# Patient Record
Sex: Female | Born: 1995 | Race: Black or African American | Hispanic: No | Marital: Single | Smoking: Former smoker
Health system: Southern US, Community
[De-identification: ages and names within clinical notes are randomized; demographics above are authoritative.]

## PROBLEM LIST (undated history)

## (undated) HISTORY — PX: OTHER SURGICAL HISTORY: SHX169

---
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
## 2017-05-15 ENCOUNTER — Encounter (HOSPITAL_COMMUNITY): Payer: Self-pay

## 2017-05-15 ENCOUNTER — Other Ambulatory Visit: Payer: Self-pay

## 2017-05-15 ENCOUNTER — Emergency Department (HOSPITAL_COMMUNITY)
Admission: EM | Admit: 2017-05-15 | Discharge: 2017-05-15 | Disposition: A | Discharge status: Home / Self Care | Payer: Self-pay | Attending: Emergency Medicine | Admitting: Emergency Medicine

## 2017-05-15 DIAGNOSIS — Y999 Unspecified external cause status: Secondary | ICD-10-CM | POA: Insufficient documentation

## 2017-05-15 DIAGNOSIS — S46202A Unspecified injury of muscle, fascia and tendon of other parts of biceps, left arm, initial encounter: Secondary | ICD-10-CM | POA: Insufficient documentation

## 2017-05-15 DIAGNOSIS — T1490XA Injury, unspecified, initial encounter: Secondary | ICD-10-CM

## 2017-05-15 DIAGNOSIS — M791 Myalgia, unspecified site: Secondary | ICD-10-CM

## 2017-05-15 DIAGNOSIS — X509XXA Other and unspecified overexertion or strenuous movements or postures, initial encounter: Secondary | ICD-10-CM | POA: Insufficient documentation

## 2017-05-15 DIAGNOSIS — M60822 Other myositis, left upper arm: Secondary | ICD-10-CM | POA: Insufficient documentation

## 2017-05-15 DIAGNOSIS — Y939 Activity, unspecified: Secondary | ICD-10-CM | POA: Insufficient documentation

## 2017-05-15 DIAGNOSIS — S46201A Unspecified injury of muscle, fascia and tendon of other parts of biceps, right arm, initial encounter: Secondary | ICD-10-CM | POA: Insufficient documentation

## 2017-05-15 DIAGNOSIS — Y929 Unspecified place or not applicable: Secondary | ICD-10-CM | POA: Insufficient documentation

## 2017-05-15 DIAGNOSIS — M609 Myositis, unspecified: Secondary | ICD-10-CM

## 2017-05-15 MED ORDER — ACETAMINOPHEN 325 MG PO TABS
650.0000 mg | ORAL_TABLET | Freq: Once | ORAL | Status: AC
Start: 2017-05-15 — End: 2017-05-15
  Administered 2017-05-15: 650 mg via ORAL
  Filled 2017-05-15: qty 2

## 2017-05-15 MED ORDER — IBUPROFEN 600 MG PO TABS: ORAL_TABLET | ORAL | 0 refills | Status: DC

## 2017-05-15 MED ORDER — ACETAMINOPHEN ER 650 MG PO TBCR: 650.0000 mg | EXTENDED_RELEASE_TABLET | Freq: Three times a day (TID) | ORAL | 0 refills | Status: DC | PRN

## 2017-05-15 NOTE — ED Provider Notes (Signed)
22 year old female comes in a chief complaint of bilateral arm pain, left worse than right.  2 days ago patient went into her first training session [facility] Provider Note   CSN: [id number] Arrival date & time: 05/15/17  4098     History   Chief Complaint Chief Complaint  Patient presents with  . Arm Pain    HPI Melinda Garrett is a 22 y.o. female.  HPI  22 year old female comes in with chief complaint of arm pain.  Patient has no significant medical history.  She states that 2 days ago she had her first personal training session.  Pt had maxed out on upper body workout.  Patient was sore yesterday, but was able to function.  Today her symptoms are severe and she is having difficulty moving her extremity without severe pain. No associated nausea, fevers.  History reviewed. No pertinent past medical history.  Patient Active Problem List   Diagnosis Date Noted  . Vertigo 10/23/2012    Past Surgical History:  Procedure Laterality Date  . umbilical surgery      OB History    No data available       Home Medications    Prior to Admission medications   Medication Sig Start Date End Date Taking? Authorizing Provider  acetaminophen (TYLENOL 8 HOUR) 650 MG CR tablet Take 1 tablet (650 mg total) by mouth every 8 (eight) hours as needed for pain. 05/15/17   Derwood Kaplan, MD  ibuprofen (ADVIL,MOTRIN) 600 MG tablet Take 1 tab PO Q6h x 2 days then Q6h prn 05/15/17   Derwood Kaplan, MD    Family History Family History  Problem Relation Age of Onset  . Seizures Sister        1 sister has sz, CP, Dev Delays  . Cerebral palsy Sister   . Other Sister   . Autism Cousin        Maternal second cousin has Autism    Social History Social History   Tobacco Use  . Smoking status: Never Smoker  . Smokeless tobacco: Never Used  Substance Use Topics  . Alcohol use: No  . Drug use: No     Allergies   Patient has no known  allergies.   Review of Systems Review of Systems  Constitutional: Positive for activity change.  Cardiovascular: Negative for chest pain.  Genitourinary: Negative for decreased urine volume and difficulty urinating.  Musculoskeletal: Positive for myalgias.  Skin: Negative for rash.     Physical Exam Updated Vital Signs BP 138/89 (BP Location: Left Arm)   Pulse 65   Temp 97.9 F (36.6 C) (Oral)   Resp 15   Ht 5\' 6"  (1.676 m)   Wt 94.3 kg (207 lb 14.4 oz)   LMP 05/13/2017 (Exact Date)   SpO2 98%   BMI 33.56 kg/m   Physical Exam  Constitutional: She is oriented to person, place, and time. She appears well-developed.  HENT:  Head: Normocephalic and atraumatic.  Eyes: EOM are normal.  Neck: Normal range of motion. Neck supple.  Cardiovascular: Normal rate.  Pulmonary/Chest: Effort normal.  Abdominal: Bowel sounds are normal.  Musculoskeletal:  Tenderness to palpation over the bicep and tricep of the left upper extremity. Neurovascularly intact upper extremities  Neurological: She is alert and oriented to person, place, and time.  Skin: Skin is warm and dry.  Nursing note and vitals reviewed.    ED Treatments / Results  Labs (all labs ordered are listed, but only  abnormal results are displayed) Labs Reviewed - No data to display  EKG  EKG Interpretation None       Radiology No results found.  Procedures Procedures (including critical care time)  Medications Ordered in ED Medications  acetaminophen (TYLENOL) tablet 650 mg (650 mg Oral Given 05/15/17 0929)     Initial Impression / Assessment and Plan / ED Course  I have reviewed the triage vital signs and the nursing notes.  Pertinent labs & imaging results that were available during my care of the patient were reviewed by me and considered in my medical decision making (see chart for details).    22 year old female comes in with chief complaint of bilateral arm pain, left worse than right. Patient  has reproducible tenderness to palpation.  There is no rash, patient is neurovascularly intact.  I suspect the patient's myalgias are due to significant muscle breakdown from her workout.  Plan is to start with warm compresses, increase fluid intake and as needed pain medication. Also advised her to increase gentle stretching exercises.   Final Clinical Impressions(s) / ED Diagnoses   Final diagnoses:  Myositis of left upper arm, unspecified myositis type  Myalgia  Muscle injury    ED Discharge Orders        Ordered    ibuprofen (ADVIL,MOTRIN) 600 MG tablet     05/15/17 0931    acetaminophen (TYLENOL 8 HOUR) 650 MG CR tablet  Every 8 hours PRN     05/15/17 0931       Derwood KaplanNanavati, Clifton Kovacic, MD 05/15/17 [phone]

## 2017-05-15 NOTE — ED Triage Notes (Signed)
She states she recently "worked out a lot". She c/o persistent left arm muscle aches, especially of bicp/triceps areas. She is in no distress. She denies injury.

## 2017-05-15 NOTE — Discharge Instructions (Signed)
Your pain is likely due to significant muscle breakdown and lactic acid build up. Hydrate well. Warm soaks and massage will help. Stretching exercises recommended.

## 2018-05-26 ENCOUNTER — Other Ambulatory Visit: Payer: Self-pay

## 2018-05-26 ENCOUNTER — Encounter (HOSPITAL_COMMUNITY): Payer: Self-pay

## 2018-05-26 ENCOUNTER — Emergency Department (HOSPITAL_COMMUNITY)
Admission: EM | Admit: 2018-05-26 | Discharge: 2018-05-26 | Disposition: A | Discharge status: Home / Self Care | Payer: Medicaid Other | Attending: Emergency Medicine | Admitting: Emergency Medicine

## 2018-05-26 DIAGNOSIS — J029 Acute pharyngitis, unspecified: Secondary | ICD-10-CM

## 2018-05-26 LAB — GROUP A STREP BY PCR: Group A Strep by PCR: NOT DETECTED

## 2018-05-26 NOTE — ED Provider Notes (Signed)
[facility]   CSN: [id number] Arrival date & time: 05/26/18  1409    History   Chief Complaint Chief Complaint  Patient presents with  . Sore Throat    HPI Melinda Garrett is a 23 y.o. female.     HPI Patient presents with sore throat.  Has had for the last several days.  Had been in [place].  1 of the people that she was there with was diagnosed with COVID-19.  Patient states she was with her but did not stay at the same motel or with was not in the same car on the way down.  Patient came back on the 14th with today being the 27th.  States she had some fevers for a couple days after getting back but those have resolved.  Occasional cough but has had a sore throat more recently.  Dull.  Worse with swallowing.  No difficulty swallowing.  Denies possibility of pregnancy.  No chest pain.  No abdominal pain. History reviewed. No pertinent past medical history.  Patient Active Problem List   Diagnosis Date Noted  . Vertigo 10/23/2012    Past Surgical History:  Procedure Laterality Date  . umbilical surgery       OB History   No obstetric history on file.      Home Medications    Prior to Admission medications   Medication Sig Start Date End Date Taking? Authorizing Provider  acetaminophen (TYLENOL 8 HOUR) 650 MG CR tablet Take 1 tablet (650 mg total) by mouth every 8 (eight) hours as needed for pain. 05/15/17   Derwood Kaplan, MD  ibuprofen (ADVIL,MOTRIN) 600 MG tablet Take 1 tab PO Q6h x 2 days then Q6h prn 05/15/17   Derwood Kaplan, MD    Family History Family History  Problem Relation Age of Onset  . Seizures Sister        1 sister has sz, CP, Dev Delays  . Cerebral palsy Sister   . Other Sister   . Autism Cousin        Maternal second cousin has Autism    Social History Social History   Tobacco Use  . Smoking status: Never Smoker  . Smokeless tobacco: Never Used  Substance Use Topics  .  Alcohol use: No  . Drug use: No     Allergies   Patient has no known allergies.   Review of Systems Review of Systems  Constitutional: Positive for fever. Negative for appetite change.       No current fever   HENT: Positive for sore throat. Negative for trouble swallowing.   Respiratory: Positive for cough. Negative for shortness of breath.   Cardiovascular: Negative for chest pain.  Gastrointestinal: Negative for abdominal pain.  Genitourinary: Negative for flank pain.  Musculoskeletal: Negative for back pain.  Skin: Negative for rash.  Neurological: Negative for weakness.  Hematological: Negative for adenopathy.  Psychiatric/Behavioral: Negative for confusion.     Physical Exam Updated Vital Signs BP 123/79   Pulse 80   Temp 99 F (37.2 C) (Oral)   Resp 16   Ht 5\' 6"  (1.676 m)   Wt 97.5 kg   LMP 05/10/2018   SpO2 96%   BMI 34.70 kg/m   Physical Exam Vitals signs and nursing note reviewed.  HENT:     Head: Normocephalic.     Mouth/Throat:     Pharynx: Posterior oropharyngeal erythema present. No oropharyngeal exudate.  Cardiovascular:  Rate and Rhythm: Normal rate and regular rhythm.  Pulmonary:     Effort: Pulmonary effort is normal.  Chest:     Chest wall: No tenderness.  Abdominal:     Palpations: Abdomen is soft.     Tenderness: There is no abdominal tenderness.  Lymphadenopathy:     Cervical: Cervical adenopathy present.  Skin:    General: Skin is warm.     Capillary Refill: Capillary refill takes less than 2 seconds.  Neurological:     Mental Status: She is alert.      ED Treatments / Results  Labs (all labs ordered are listed, but only abnormal results are displayed) Labs Reviewed  GROUP A STREP BY PCR    EKG None  Radiology No results found.  Procedures Procedures (including critical care time)  Medications Ordered in ED Medications - No data to display   Initial Impression / Assessment and Plan / ED Course  I have  reviewed the triage vital signs and the nursing notes.  Pertinent labs & imaging results that were available during my care of the patient were reviewed by me and considered in my medical decision making (see chart for details).       Patient with sore throat.  Had had a potential covert exposure, however at this point I think she is cleared from possible infection.  She is been more than 72 hours without fever and has been more than 7 days since start of fever.  Strep test pending.  Care turned over to Dr. Jacqulyn Bath.  Final Clinical Impressions(s) / ED Diagnoses   Final diagnoses:  Pharyngitis, unspecified etiology    ED Discharge Orders    None       Benjiman Core, MD 05/26/18 [phone]

## 2018-05-26 NOTE — Discharge Instructions (Addendum)
You have been seen in the Emergency Department (ED) today for a likely viral illness.  Please drink plenty of clear fluids (water, Gatorade, chicken broth, etc).  You may use Tylenol and/or Motrin according to label instructions.  You can alternate between the two without any side effects.   Please follow up with your doctor as listed above.  Please stay home for the next 2 weeks and avoid contact with others.   Call your doctor or return to the Emergency Department (ED) if you are unable to tolerate fluids due to vomiting, have worsening trouble breathing, become extremely tired or difficult to awaken, or if you develop any other symptoms that concern you.

## 2018-05-26 NOTE — ED Triage Notes (Addendum)
Patient c/o sore throat and was at [place], [place] from 3/9-3/13/20. Patient states one of the people she was around at [employer] did test positive for Covid-19

## 2018-05-26 NOTE — ED Provider Notes (Addendum)
Blood pressure 123/79, pulse 80, temperature 99 F (37.2 C), temperature source Oral, resp. rate 16, height 5\' 6"  (1.676 m), weight 97.5 kg, last menstrual period 05/10/2018, SpO2 96 %.  Assuming care from Dr. Rubin Payor.  In short, Melinda Garrett is a 23 y.o. female with a chief complaint of Sore Throat .  Refer to the original H&P for additional details.  The current plan of care is to f/u on strep swab. No indication for outpatient COVID testing. COVID contact was greater than 2 weeks prior so no indication for additional quarantine agreement.   04:00 PM  Strep negative.  I did advise that the patient remain in quarantine for 2 weeks.  She is having sore throat and has lost smell/taste. Patient understands recommendations to stay home. Discussed ED return precautions.   Melinda Garrett was evaluated in Emergency Department on 05/26/2018 for the symptoms described in the history of present illness. She was evaluated in the context of the global COVID-19 pandemic, which necessitated consideration that the patient might be at risk for infection with the SARS-CoV-2 virus that causes COVID-19. Institutional protocols and algorithms that pertain to the evaluation of patients at risk for COVID-19 are in a state of rapid change based on information released by regulatory bodies including the CDC and federal and state organizations. These policies and algorithms were followed during the patient's care in the ED.    Maia Plan, MD 05/26/18 1608    Maia Plan, MD 05/26/18 [phone]

## 2018-05-26 NOTE — ED Notes (Signed)
MD at bedside. 

## 2019-07-27 ENCOUNTER — Ambulatory Visit: Payer: Medicaid Other | Attending: Nurse Practitioner | Admitting: Nurse Practitioner

## 2019-07-27 ENCOUNTER — Other Ambulatory Visit: Payer: Self-pay

## 2019-09-26 ENCOUNTER — Telehealth: Payer: Self-pay | Admitting: Nurse Practitioner

## 2020-04-02 ENCOUNTER — Ambulatory Visit: Payer: Medicaid Other | Attending: Nurse Practitioner | Admitting: Nurse Practitioner

## 2020-04-02 ENCOUNTER — Other Ambulatory Visit: Payer: Self-pay

## 2020-04-04 ENCOUNTER — Telehealth: Payer: Self-pay

## 2020-04-04 NOTE — Telephone Encounter (Signed)
Called patient and LVM advising her to call [phone] in order to reschedule her missed appointment on 04/02/20.

## 2020-07-04 ENCOUNTER — Ambulatory Visit: Payer: Medicaid Other | Admitting: Nurse Practitioner

## 2020-12-22 ENCOUNTER — Ambulatory Visit (INDEPENDENT_AMBULATORY_CARE_PROVIDER_SITE_OTHER): Payer: 59 | Admitting: Family Medicine

## 2020-12-22 ENCOUNTER — Other Ambulatory Visit: Payer: Self-pay

## 2020-12-22 ENCOUNTER — Encounter: Payer: Self-pay | Admitting: Family Medicine

## 2020-12-22 VITALS — BP 126/82 | HR 74 | Temp 98.1°F | Resp 16 | Ht 66.0 in | Wt 240.8 lb

## 2020-12-22 DIAGNOSIS — E6609 Other obesity due to excess calories: Secondary | ICD-10-CM | POA: Diagnosis not present

## 2020-12-22 DIAGNOSIS — Z6838 Body mass index (BMI) 38.0-38.9, adult: Secondary | ICD-10-CM

## 2020-12-22 DIAGNOSIS — Z0000 Encounter for general adult medical examination without abnormal findings: Secondary | ICD-10-CM

## 2020-12-22 DIAGNOSIS — Z7689 Persons encountering health services in other specified circumstances: Secondary | ICD-10-CM

## 2020-12-23 ENCOUNTER — Encounter: Payer: Self-pay | Admitting: Family Medicine

## 2020-12-23 LAB — CMP14+EGFR
ALT: 13 IU/L (ref 0–32)
AST: 15 IU/L (ref 0–40)
Albumin/Globulin Ratio: 1.8 (ref 1.2–2.2)
Albumin: 4.3 g/dL (ref 3.9–5.0)
Alkaline Phosphatase: 49 IU/L (ref 44–121)
BUN/Creatinine Ratio: 14 (ref 9–23)
BUN: 10 mg/dL (ref 6–20)
Bilirubin Total: 0.3 mg/dL (ref 0.0–1.2)
CO2: 22 mmol/L (ref 20–29)
Calcium: 9.2 mg/dL (ref 8.7–10.2)
Chloride: 105 mmol/L (ref 96–106)
Creatinine, Ser: 0.74 mg/dL (ref 0.57–1.00)
Globulin, Total: 2.4 g/dL (ref 1.5–4.5)
Glucose: 80 mg/dL (ref 70–99)
Potassium: 4.3 mmol/L (ref 3.5–5.2)
Sodium: 139 mmol/L (ref 134–144)
Total Protein: 6.7 g/dL (ref 6.0–8.5)
eGFR: 116 mL/min/{1.73_m2} (ref >59)

## 2020-12-23 LAB — CBC WITH DIFFERENTIAL/PLATELET
Basophils Absolute: 0 10*3/uL (ref 0.0–0.2)
Basos: 1 %
EOS (ABSOLUTE): 0.1 10*3/uL (ref 0.0–0.4)
Eos: 2 %
Hematocrit: 37.3 % (ref 34.0–46.6)
Hemoglobin: 12.5 g/dL (ref 11.1–15.9)
Immature Grans (Abs): 0 10*3/uL (ref 0.0–0.1)
Immature Granulocytes: 0 %
Lymphocytes Absolute: 1.9 10*3/uL (ref 0.7–3.1)
Lymphs: 32 %
MCH: 29.3 pg (ref 26.6–33.0)
MCHC: 33.5 g/dL (ref 31.5–35.7)
MCV: 88 fL (ref 79–97)
Monocytes Absolute: 0.5 10*3/uL (ref 0.1–0.9)
Monocytes: 8 %
Neutrophils Absolute: 3.4 10*3/uL (ref 1.4–7.0)
Neutrophils: 57 %
Platelets: 247 10*3/uL (ref 150–450)
RBC: 4.26 x10E6/uL (ref 3.77–5.28)
RDW: 13.1 % (ref 11.7–15.4)
WBC: 6 10*3/uL (ref 3.4–10.8)

## 2020-12-23 LAB — LIPID PANEL
Chol/HDL Ratio: 3.7 ratio (ref 0.0–4.4)
Cholesterol, Total: 186 mg/dL (ref 100–199)
HDL: 50 mg/dL (ref >39)
LDL Chol Calc (NIH): 113 mg/dL — ABNORMAL HIGH (ref 0–99)
Triglycerides: 131 mg/dL (ref 0–149)
VLDL Cholesterol Cal: 23 mg/dL (ref 5–40)

## 2020-12-23 NOTE — Progress Notes (Signed)
New Patient Office Visit  Subjective:  Patient ID: Melinda Garrett, female    DOB: 15-Nov-1995  Age: 25 y.o. MRN: [id number]  CC:  Chief Complaint  Patient presents with   Establish Care   Knee Pain    HPI LANDI BISCARDI presents for to establish care and for routine well exam. Patient denies acute complaints or concerns.   History reviewed. No pertinent past medical history.  Past Surgical History:  Procedure Laterality Date   umbilical surgery      Family History  Problem Relation Age of Onset   Seizures Sister        1 sister has sz, CP, Dev Delays   Cerebral palsy Sister    Other Sister    Autism Cousin        Maternal second cousin has Autism    Social History   Socioeconomic History   Marital status: Single    Spouse name: Not on file   Number of children: Not on file   Years of education: Not on file   Highest education level: Not on file  Occupational History   Not on file  Tobacco Use   Smoking status: Never   Smokeless tobacco: Never  Vaping Use   Vaping Use: Never used  Substance and Sexual Activity   Alcohol use: No   Drug use: No   Sexual activity: Never  Other Topics Concern   Not on file  Social History Narrative   Not on file   Social Determinants of Health   Financial Resource Strain: Not on file  Food Insecurity: Not on file  Transportation Needs: Not on file  Physical Activity: Not on file  Stress: Not on file  Social Connections: Not on file  Intimate Partner Violence: Not on file    ROS Review of Systems  All other systems reviewed and are negative.  Objective:   Today's Vitals: BP 126/82   Pulse 74   Temp 98.1 F (36.7 C) (Oral)   Resp 16   Ht $R'5\' 6"'gr$  (1.676 m)   Wt 240 lb 12.8 oz (109.2 kg)   SpO2 98%   BMI 38.87 kg/m   Physical Exam Vitals and nursing note reviewed.  Constitutional:      General: She is not in acute distress.    Appearance: She is obese.  HENT:     Head: Normocephalic and atraumatic.      Right Ear: Tympanic membrane, ear canal and external ear normal.     Left Ear: Tympanic membrane, ear canal and external ear normal.     Nose: Nose normal.     Mouth/Throat:     Mouth: Mucous membranes are moist.     Pharynx: Oropharynx is clear.  Eyes:     Conjunctiva/sclera: Conjunctivae normal.     Pupils: Pupils are equal, round, and reactive to light.  Neck:     Thyroid: No thyromegaly.  Cardiovascular:     Rate and Rhythm: Normal rate and regular rhythm.     Heart sounds: Normal heart sounds. No murmur heard. Pulmonary:     Effort: Pulmonary effort is normal. No respiratory distress.     Breath sounds: Normal breath sounds.  Abdominal:     General: There is no distension.     Palpations: Abdomen is soft. There is no mass.     Tenderness: There is no abdominal tenderness.  Musculoskeletal:        General: Normal range of motion.  Cervical back: Normal range of motion and neck supple.  Skin:    General: Skin is warm and dry.  Neurological:     General: No focal deficit present.     Mental Status: She is alert and oriented to person, place, and time.  Psychiatric:        Mood and Affect: Mood normal.        Behavior: Behavior normal.    Assessment & Plan:   1. Well woman exam (no gynecological exam) Unremarkable exam. Routine labs ordered. Patient defers pap to gyn - Lipid Panel - CBC with Differential - CMP14+EGFR  2. Class 2 obesity due to excess calories without serious comorbidity with body mass index (BMI) of 38.0 to 38.9 in adult Discussed dietary and activity options. Goal is 3-5lbs/mo wt loss. Continue and monitor  3. Encounter to establish care     Outpatient Encounter Medications as of 12/22/2020  Medication Sig   acetaminophen (TYLENOL 8 HOUR) 650 MG CR tablet Take 1 tablet (650 mg total) by mouth every 8 (eight) hours as needed for pain.   ibuprofen (ADVIL,MOTRIN) 600 MG tablet Take 1 tab PO Q6h x 2 days then Q6h prn   No  facility-administered encounter medications on file as of 12/22/2020.    Follow-up: Return in about 1 year (around 12/22/2021) for physical.   Becky Sax, MD

## 2021-10-28 ENCOUNTER — Encounter: Payer: 59 | Admitting: Radiology

## 2021-12-16 ENCOUNTER — Other Ambulatory Visit: Payer: Self-pay

## 2021-12-16 ENCOUNTER — Encounter (HOSPITAL_BASED_OUTPATIENT_CLINIC_OR_DEPARTMENT_OTHER): Payer: Self-pay

## 2021-12-16 ENCOUNTER — Emergency Department (HOSPITAL_BASED_OUTPATIENT_CLINIC_OR_DEPARTMENT_OTHER)
Admission: EM | Admit: 2021-12-16 | Discharge: 2021-12-16 | Disposition: A | Discharge status: Home / Self Care | Payer: 59 | Attending: Emergency Medicine | Admitting: Emergency Medicine

## 2021-12-16 DIAGNOSIS — H109 Unspecified conjunctivitis: Secondary | ICD-10-CM

## 2021-12-16 DIAGNOSIS — B9689 Other specified bacterial agents as the cause of diseases classified elsewhere: Secondary | ICD-10-CM | POA: Diagnosis not present

## 2021-12-16 DIAGNOSIS — H1089 Other conjunctivitis: Secondary | ICD-10-CM | POA: Diagnosis not present

## 2021-12-16 DIAGNOSIS — H1033 Unspecified acute conjunctivitis, bilateral: Secondary | ICD-10-CM | POA: Diagnosis not present

## 2021-12-16 DIAGNOSIS — H5789 Other specified disorders of eye and adnexa: Secondary | ICD-10-CM | POA: Diagnosis present

## 2021-12-16 MED ORDER — ERYTHROMYCIN 5 MG/GM OP OINT: TOPICAL_OINTMENT | Freq: Four times a day (QID) | OPHTHALMIC | 0 refills | Status: AC

## 2021-12-16 MED ORDER — FLUORESCEIN SODIUM 1 MG OP STRP
1.0000 | ORAL_STRIP | Freq: Once | OPHTHALMIC | Status: AC
  Administered 2021-12-16: 1 via OPHTHALMIC
  Filled 2021-12-16: qty 1

## 2021-12-16 MED ORDER — TETRACAINE HCL 0.5 % OP SOLN
1.0000 [drp] | Freq: Once | OPHTHALMIC | Status: AC
  Administered 2021-12-16: 1 [drp] via OPHTHALMIC
  Filled 2021-12-16: qty 4

## 2021-12-16 NOTE — ED Notes (Signed)
Reviewed AVS/discharge instruction with patient. Time allotted for and all questions answered. Patient is agreeable for d/c and escorted to ed exit by staff.  

## 2021-12-16 NOTE — ED Provider Notes (Signed)
[facility] EMERGENCY DEPT Provider Note   CSN: [id number] Arrival date & time: 12/16/21  1829     History  Chief Complaint  Patient presents with   Eye Pain    Melinda Garrett is a 26 y.o. female.  Patient with no pertinent past medical history presents today with complaints of bilateral eye irritation.  She states that her symptoms began today initially in the right eye and then later in the left eye as well.  She states that some when she was in contact with a few days ago recently tested positive for pinkeye and she is concerned for same.  States that she has yellow drainage coming from both eyes as well as bilateral eye irritation.  She denies any fevers, chills, blurred vision, painful eye movements, or eye swelling.  No history of similar symptoms previously. She does not wear contact lenses.  The history is provided by the patient. No language interpreter was used.  Eye Pain       Home Medications Prior to Admission medications   Medication Sig Start Date End Date Taking? Authorizing Provider  acetaminophen (TYLENOL 8 HOUR) 650 MG CR tablet Take 1 tablet (650 mg total) by mouth every 8 (eight) hours as needed for pain. 05/15/17   Varney Biles, MD  ibuprofen (ADVIL,MOTRIN) 600 MG tablet Take 1 tab PO Q6h x 2 days then Q6h prn 05/15/17   Varney Biles, MD      Allergies    Patient has no known allergies.    Review of Systems   Review of Systems  Eyes:  Positive for pain, discharge, redness and itching.  All other systems reviewed and are negative.   Physical Exam Updated Vital Signs BP (!) 152/92 (BP Location: Right Arm)   Pulse 78   Temp 98.2 F (36.8 C) (Oral)   Resp 18   Ht 5\' 6"  (1.676 m)   Wt 110.2 kg   LMP 12/04/2021   SpO2 98%   BMI 39.22 kg/m  Physical Exam Vitals and nursing note reviewed.  Constitutional:      General: She is not in acute distress.    Appearance: Normal appearance. She is normal weight. She is not  ill-appearing, toxic-appearing or diaphoretic.  HENT:     Head: Normocephalic and atraumatic.  Eyes:     Comments: Bilateral conjunctival erythema with purulent drainage. Flurorescin stain performed without uptake under woods lamp. EOMs intact without pain. Visual acuity grossly intact. No periorbital swelling or erythema  Cardiovascular:     Rate and Rhythm: Normal rate.  Pulmonary:     Effort: Pulmonary effort is normal. No respiratory distress.  Musculoskeletal:        General: Normal range of motion.     Cervical back: Normal range of motion.  Skin:    General: Skin is warm and dry.  Neurological:     General: No focal deficit present.     Mental Status: She is alert.  Psychiatric:        Mood and Affect: Mood normal.        Behavior: Behavior normal.     ED Results / Procedures / Treatments   Labs (all labs ordered are listed, but only abnormal results are displayed) Labs Reviewed - No data to display  EKG None  Radiology No results found.  Procedures Procedures    Medications Ordered in ED Medications  fluorescein ophthalmic strip 1 strip (has no administration in time range)  tetracaine (PONTOCAINE) 0.5 % ophthalmic solution  1 drop (has no administration in time range)    ED Course/ Medical Decision Making/ A&P                           Medical Decision Making Risk Prescription drug management.    Patient presents today with 1 day of bilateral eye irritation and purulent drainage.  She is afebrile, nontoxic-appearing, and in no acute distress with reassuring vital signs.  Pt presentation consistent with bacterial conjunctivitis based on  eye exam, she is not a contact lens wearer. Will give erythromycin ointment for same. No evidence of HSV or VSV infection. Exam non-concerning for orbital cellulitis, hyphema, corneal ulcers, corneal abrasions or trauma.  Patient has been instructed to use cool compresses and practice personal hygiene with frequent hand  washing.  Patient understands to follow up with ophthalmology, especially if new symptoms including change in vision, purulent drainage, or entrapment occur.  Patient is understanding and amenable with plan, educated on red flag symptoms that would prompt immediate return.  Patient discharged in stable condition.   Final Clinical Impression(s) / ED Diagnoses Final diagnoses:  Bacterial conjunctivitis of both eyes    Rx / DC Orders ED Discharge Orders          Ordered    erythromycin ophthalmic ointment  4 times daily        12/16/21 2204          An After Visit Summary was printed and given to the patient.     Nestor Lewandowsky 12/16/21 2248    Elgie Congo, MD 12/17/21 [phone]

## 2021-12-16 NOTE — Discharge Instructions (Addendum)
As we discussed, your work-up in the ER today was consistent with bacterial conjunctivitis or pinkeye.  I have given you a prescription for an antibiotic ointment for you to use as prescribed in its entirety for management of your symptoms.  You may also do cool compresses and regular flushing for additional relief.  Follow-up with your primary care doctor as needed for additional evaluation and management of your symptoms.  Return if development of any new or worsening symptoms.

## 2021-12-16 NOTE — ED Triage Notes (Signed)
Bilateral eye pain and reddness with drainage.

## 2021-12-21 ENCOUNTER — Encounter (HOSPITAL_BASED_OUTPATIENT_CLINIC_OR_DEPARTMENT_OTHER): Payer: Self-pay | Admitting: Urology

## 2021-12-21 ENCOUNTER — Emergency Department (HOSPITAL_BASED_OUTPATIENT_CLINIC_OR_DEPARTMENT_OTHER)
Admission: EM | Admit: 2021-12-21 | Discharge: 2021-12-21 | Disposition: A | Discharge status: Home / Self Care | Payer: 59 | Attending: Emergency Medicine | Admitting: Emergency Medicine

## 2021-12-21 ENCOUNTER — Other Ambulatory Visit: Payer: Self-pay

## 2021-12-21 DIAGNOSIS — Z20822 Contact with and (suspected) exposure to covid-19: Secondary | ICD-10-CM | POA: Diagnosis not present

## 2021-12-21 DIAGNOSIS — J029 Acute pharyngitis, unspecified: Secondary | ICD-10-CM | POA: Diagnosis not present

## 2021-12-21 LAB — RESP PANEL BY RT-PCR (FLU A&B, COVID) ARPGX2
Influenza A by PCR: NEGATIVE
Influenza B by PCR: NEGATIVE
SARS Coronavirus 2 by RT PCR: NEGATIVE

## 2021-12-21 LAB — GROUP A STREP BY PCR: Group A Strep by PCR: NOT DETECTED

## 2021-12-21 MED ORDER — DEXAMETHASONE 4 MG PO TABS
6.0000 mg | ORAL_TABLET | Freq: Once | ORAL | Status: AC
  Administered 2021-12-21: 6 mg via ORAL
  Filled 2021-12-21: qty 2

## 2021-12-21 NOTE — ED Provider Notes (Signed)
[facility] EMERGENCY DEPARTMENT  Provider Note  CSN: [id number] Arrival date & time: 12/21/21 0159  History No chief complaint on file.   Melinda Garrett is a 26 y.o. female reports about a week of progressively worsening sore throat, feels like razor blades when she swallows, not improved much with OTC meds. No fever, some occasional cough. She was in the ED for conjunctivitis 5 days ago and didn't mention it then because it wasn't very severe.    Home Medications Prior to Admission medications   Medication Sig Start Date End Date Taking? Authorizing Provider  acetaminophen (TYLENOL 8 HOUR) 650 MG CR tablet Take 1 tablet (650 mg total) by mouth every 8 (eight) hours as needed for pain. 05/15/17   Varney Biles, MD  erythromycin ophthalmic ointment Place into both eyes 4 (four) times daily for 7 days. Place a 1/2 inch ribbon of ointment into the lower eyelid. 12/16/21 12/23/21  Smoot, Leary Roca, PA-C  ibuprofen (ADVIL,MOTRIN) 600 MG tablet Take 1 tab PO Q6h x 2 days then Q6h prn 05/15/17   Varney Biles, MD     Allergies    Patient has no known allergies.   Review of Systems   Review of Systems Please see HPI for pertinent positives and negatives  Physical Exam BP 136/85 (BP Location: Right Arm)   Pulse 82   Temp 98.4 F (36.9 C) (Oral)   Resp 18   Ht 5\' 6"  (1.676 m)   Wt 122.5 kg   LMP 12/04/2021   SpO2 97%   BMI 43.58 kg/m   Physical Exam Vitals and nursing note reviewed.  Constitutional:      Appearance: Normal appearance.  HENT:     Head: Normocephalic and atraumatic.     Nose: Nose normal.     Mouth/Throat:     Mouth: Mucous membranes are moist.     Pharynx: Posterior oropharyngeal erythema present. No oropharyngeal exudate.  Eyes:     Extraocular Movements: Extraocular movements intact.     Conjunctiva/sclera: Conjunctivae normal.  Cardiovascular:     Rate and Rhythm: Normal rate.  Pulmonary:     Effort: Pulmonary effort is normal.      Breath sounds: Normal breath sounds.  Abdominal:     General: Abdomen is flat.     Palpations: Abdomen is soft.     Tenderness: There is no abdominal tenderness.  Musculoskeletal:        General: No swelling. Normal range of motion.     Cervical back: Neck supple.  Lymphadenopathy:     Cervical: No cervical adenopathy.  Skin:    General: Skin is warm and dry.  Neurological:     General: No focal deficit present.     Mental Status: She is alert.  Psychiatric:        Mood and Affect: Mood normal.     ED Results / Procedures / Treatments   EKG None  Procedures Procedures  Medications Ordered in the ED Medications  dexamethasone (DECADRON) tablet 6 mg (has no administration in time range)    Initial Impression and Plan  Patient here with symptoms consistent with a viral pharyngitis. Covid and Strep swabs are neg. Given duration of symptoms will give a dose of decadron here. Otherwise continue with OTC symptomatic care.   ED Course       MDM Rules/Calculators/A&P Medical Decision Making Problems Addressed: Pharyngitis, unspecified etiology: acute illness or injury  Amount and/or Complexity of Data Reviewed Labs: ordered. Decision-making details  documented in ED Course.  Risk Prescription drug management.    Final Clinical Impression(s) / ED Diagnoses Final diagnoses:  Pharyngitis, unspecified etiology    Rx / DC Orders ED Discharge Orders     None        Pollyann Savoy, MD 12/21/21 [phone]

## 2021-12-21 NOTE — ED Triage Notes (Signed)
Pt states sore throat x 1 week  States " feels like swallowing razor blades"  Denies fever  Denies N/V

## 2022-01-12 ENCOUNTER — Other Ambulatory Visit: Payer: Self-pay

## 2022-01-12 ENCOUNTER — Emergency Department (HOSPITAL_BASED_OUTPATIENT_CLINIC_OR_DEPARTMENT_OTHER): Payer: 59 | Admitting: Radiology

## 2022-01-12 ENCOUNTER — Encounter (HOSPITAL_BASED_OUTPATIENT_CLINIC_OR_DEPARTMENT_OTHER): Payer: Self-pay

## 2022-01-12 ENCOUNTER — Emergency Department (HOSPITAL_BASED_OUTPATIENT_CLINIC_OR_DEPARTMENT_OTHER)
Admission: EM | Admit: 2022-01-12 | Discharge: 2022-01-12 | Disposition: A | Discharge status: Home / Self Care | Payer: 59 | Attending: Emergency Medicine | Admitting: Emergency Medicine

## 2022-01-12 DIAGNOSIS — S76312A Strain of muscle, fascia and tendon of the posterior muscle group at thigh level, left thigh, initial encounter: Secondary | ICD-10-CM | POA: Insufficient documentation

## 2022-01-12 DIAGNOSIS — Y990 Civilian activity done for income or pay: Secondary | ICD-10-CM | POA: Insufficient documentation

## 2022-01-12 DIAGNOSIS — X500XXA Overexertion from strenuous movement or load, initial encounter: Secondary | ICD-10-CM | POA: Diagnosis not present

## 2022-01-12 DIAGNOSIS — Y92512 Supermarket, store or market as the place of occurrence of the external cause: Secondary | ICD-10-CM | POA: Insufficient documentation

## 2022-01-12 DIAGNOSIS — M25552 Pain in left hip: Secondary | ICD-10-CM | POA: Diagnosis not present

## 2022-01-12 DIAGNOSIS — S39012A Strain of muscle, fascia and tendon of lower back, initial encounter: Secondary | ICD-10-CM | POA: Diagnosis not present

## 2022-01-12 DIAGNOSIS — S76012A Strain of muscle, fascia and tendon of left hip, initial encounter: Secondary | ICD-10-CM

## 2022-01-12 DIAGNOSIS — S79912A Unspecified injury of left hip, initial encounter: Secondary | ICD-10-CM | POA: Diagnosis present

## 2022-01-12 NOTE — ED Notes (Signed)
C/o left hip and back pain  x 2 weeks, worse today.  Has been taken aleve with no relief.   Hip xray was negative

## 2022-01-12 NOTE — ED Triage Notes (Signed)
Pt c/o L hip/back, "hurts to walks sometimes," x "a couple weeks, but today & yesterday it's been really bad." Advises aleve for pain, little/no relief. Denies radiation to L leg, no hx of trauma/ injury

## 2022-01-12 NOTE — Discharge Instructions (Signed)

## 2022-01-12 NOTE — ED Notes (Signed)
Pt arrived from lobby to room

## 2022-01-12 NOTE — ED Provider Notes (Signed)
[facility] EMERGENCY DEPT Provider Note   CSN: [id number] Arrival date & time: 01/12/22  1952     History  Chief Complaint  Patient presents with   Hip Pain    L    Melinda Garrett is a 26 y.o. female.   Hip Pain  Patient is a 26 year old female with no pertinent past medical history presented emergency room today with complaints of left hip pain for the past couple weeks.  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  She states that lifting and moving and "overdoing it "has made her pain worse.  She denies any radiating pain down her leg.  No numbness or weakness.  She denies any falls or traumas.  She is on a blood thinners.  She has been using Aleve for pain which she states seemed to help.  Denies any urinary frequency urgency dysuria or hematuria.  No midline back pain per patient.    Home Medications Prior to Admission medications   Medication Sig Start Date End Date Taking? Authorizing Provider  acetaminophen (TYLENOL 8 HOUR) 650 MG CR tablet Take 1 tablet (650 mg total) by mouth every 8 (eight) hours as needed for pain. 05/15/17   Varney Biles, MD  ibuprofen (ADVIL,MOTRIN) 600 MG tablet Take 1 tab PO Q6h x 2 days then Q6h prn 05/15/17   Varney Biles, MD      Allergies    Patient has no known allergies.    Review of Systems   Review of Systems  Physical Exam Updated Vital Signs BP (!) 133/100 (BP Location: Right Arm)   Pulse 81   Temp 97.6 F (36.4 C) (Oral)   Resp 18   LMP 12/04/2021   SpO2 100%  Physical Exam Vitals and nursing note reviewed.  Constitutional:      General: She is not in acute distress. HENT:     Head: Atraumatic.  Eyes:     General: No scleral icterus. Cardiovascular:     Rate and Rhythm: Normal rate and regular rhythm.     Pulses: Normal pulses.     Heart sounds: Normal heart sounds.  Pulmonary:     Effort: Pulmonary effort is normal.   Musculoskeletal:     Cervical back: Normal range of motion.     Comments: Focal muscular tenderness of the left buttocks  No midline C, T, L-spine tenderness  Skin:    General: Skin is warm and dry.     Capillary Refill: Capillary refill takes less than 2 seconds.  Neurological:     Mental Status: She is alert. Mental status is at baseline.     Comments: Negative straight leg raise  Sensation intact in bilateral lower extremities, pulses 2+ and symmetric bilateral lower extremities  Psychiatric:        Mood and Affect: Mood normal.        Behavior: Behavior normal.     ED Results / Procedures / Treatments   Labs (all labs ordered are listed, but only abnormal results are displayed) Labs Reviewed - No data to display  EKG None  Radiology DG Hip Unilat W or Wo Pelvis 2-3 Views Left  Result Date: 01/12/2022 CLINICAL DATA:  Left hip pain EXAM: DG HIP (WITH OR WITHOUT PELVIS) 2-3V LEFT COMPARISON:  None Available. FINDINGS: There is no evidence of hip fracture or dislocation. There is no evidence of arthropathy or other focal bone abnormality. IMPRESSION: Negative. Electronically Signed  By: Helyn Numbers M.D.   On: 01/12/2022 20:35    Procedures Procedures    Medications Ordered in ED Medications - No data to display  ED Course/ Medical Decision Making/ A&P                           Medical Decision Making Amount and/or Complexity of Data Reviewed Radiology: ordered.   Patient is a 26 year old female with no pertinent past medical history presented emergency room today with complaints of left hip pain for the past couple weeks.  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  She states that lifting and moving and "overdoing it "has made her pain worse.  She denies any radiating pain down her leg.  No numbness or weakness.  She denies any falls or traumas.  She is on a blood thinners.  She has been  using Aleve for pain which she states seemed to help.  Denies any urinary frequency urgency dysuria or hematuria.  No midline back pain per patient.  Physical exam notable for muscular tenderness of the left buttocks with focal trigger point  Patient has symptoms consistent with a muscle strain of left buttocks.  Recommend conservative therapy.  Follow-up with PCP   Final Clinical Impression(s) / ED Diagnoses Final diagnoses:  Muscle strain of left gluteal region, initial encounter    Rx / DC Orders ED Discharge Orders     None         Gailen Shelter, [place] 01/12/22 2311    Lonell Grandchild, MD 01/13/22 1142

## 2022-02-17 ENCOUNTER — Encounter (HOSPITAL_BASED_OUTPATIENT_CLINIC_OR_DEPARTMENT_OTHER): Payer: Self-pay

## 2022-02-17 ENCOUNTER — Other Ambulatory Visit: Payer: Self-pay

## 2022-02-17 ENCOUNTER — Emergency Department (HOSPITAL_BASED_OUTPATIENT_CLINIC_OR_DEPARTMENT_OTHER)
Admission: EM | Admit: 2022-02-17 | Discharge: 2022-02-17 | Disposition: A | Discharge status: Home / Self Care | Payer: 59 | Attending: Emergency Medicine | Admitting: Emergency Medicine

## 2022-02-17 DIAGNOSIS — M79641 Pain in right hand: Secondary | ICD-10-CM | POA: Diagnosis not present

## 2022-02-17 DIAGNOSIS — B079 Viral wart, unspecified: Secondary | ICD-10-CM | POA: Insufficient documentation

## 2022-02-17 LAB — HIV ANTIBODY (ROUTINE TESTING W REFLEX): HIV Screen 4th Generation wRfx: NONREACTIVE

## 2022-02-17 MED ORDER — SALICYLIC ACID 17 % EX GEL: Freq: Every day | CUTANEOUS | 0 refills | Status: DC

## 2022-02-17 NOTE — ED Triage Notes (Signed)
She reports feeling a "painful sore" on the palm of right hand. She denies fever, nor any other sign of current illness.

## 2022-02-17 NOTE — Discharge Instructions (Addendum)
We evaluated you for your hand rash.  Your rash is likely a wart.  I have prescribed you an acid solution which you can apply to the wart once daily to help it dissolve.  To be as cautious as possible, we are also testing you for syphilis as this can cause a rash on your hands.  If this test comes back positive, please return to the emergency department.

## 2022-02-17 NOTE — ED Provider Notes (Signed)
[facility] EMERGENCY DEPT Provider Note  CSN: [id number] Arrival date & time: 02/17/22 5038  Chief Complaint(s) Painful lesion palm of right hand  HPI Melinda Garrett is a 26 y.o. female without significant past medical history presenting to the emergency department for lesion on palm.  She reports that this began a few days ago.  She reports it is in the crease of her palm and painful when she closes her hand.  No fevers or chills or other complaints.  No other rashes.   Past Medical History History reviewed. No pertinent past medical history. Patient Active Problem List   Diagnosis Date Noted   Vertigo 10/23/2012   Home Medication(s) Prior to Admission medications   Medication Sig Start Date End Date Taking? Authorizing Provider  salicylic acid 17 % gel Apply topically daily. 02/17/22  Yes Lonell Grandchild, MD  acetaminophen (TYLENOL 8 HOUR) 650 MG CR tablet Take 1 tablet (650 mg total) by mouth every 8 (eight) hours as needed for pain. 05/15/17   Derwood Kaplan, MD  ibuprofen (ADVIL,MOTRIN) 600 MG tablet Take 1 tab PO Q6h x 2 days then Q6h prn 05/15/17   Derwood Kaplan, MD                                                                                                                                    Past Surgical History Past Surgical History:  Procedure Laterality Date   umbilical surgery     Family History Family History  Problem Relation Age of Onset   Seizures Sister        1 sister has sz, CP, Dev Delays   Cerebral palsy Sister    Other Sister    Autism Cousin        Maternal second cousin has Autism    Social History Social History   Tobacco Use   Smoking status: Never   Smokeless tobacco: Never  Vaping Use   Vaping Use: Never used  Substance Use Topics   Alcohol use: Yes   Drug use: Never   Allergies Patient has no known allergies.  Review of Systems Review of Systems  All other systems reviewed and are negative.   Physical  Exam Vital Signs  I have reviewed the triage vital signs BP 126/82   Pulse 79   Temp 98.6 F (37 C) (Oral)   Resp 16   LMP 02/01/2022 (Exact Date)   SpO2 99%  Physical Exam Vitals and nursing note reviewed.  Constitutional:      Appearance: Normal appearance.  HENT:     Head: Normocephalic and atraumatic.     Mouth/Throat:     Mouth: Mucous membranes are moist.  Eyes:     Conjunctiva/sclera: Conjunctivae normal.  Cardiovascular:     Rate and Rhythm: Normal rate.  Pulmonary:     Effort: Pulmonary effort is normal. No respiratory distress.  Abdominal:     General: Abdomen  is flat.  Musculoskeletal:        General: No deformity.  Skin:    General: Skin is warm and dry.     Capillary Refill: Capillary refill takes less than 2 seconds.     Comments: On the palm, overlying the palmar crease between the fifth and fourth fingers of the right hand there is a approximately 0.5 x 0.5 cm verrucous growth  Neurological:     General: No focal deficit present.     Mental Status: She is alert. Mental status is at baseline.  Psychiatric:        Mood and Affect: Mood normal.        Behavior: Behavior normal.     ED Results and Treatments Labs (all labs ordered are listed, but only abnormal results are displayed) Labs Reviewed  RPR  HIV ANTIBODY (ROUTINE TESTING W REFLEX)                                                                                                                          Radiology No results found.  Pertinent labs & imaging results that were available during my care of the patient were reviewed by me and considered in my medical decision making (see MDM for details).  Medications Ordered in ED Medications - No data to display                                                                                                                                   Procedures Procedures  (including critical care time)  Medical Decision Making / ED  Course   MDM:  26 year old female presenting to the emergency department with rash on palm.  Rash appears to be a wart.  Will prescribe salicylic acid.  Out of a preponderance of caution we will check for syphilis with RPR testing.  Patient knows to return if this comes back positive.  No other lesions so overall low concern for syphilis, will defer any empiric therapy.  No redness or warmth, purulence to suggest skin or soft tissue infection.      Lab Tests: -I ordered, reviewed, and interpreted labs.   The pertinent results include:   Labs Reviewed  RPR  HIV ANTIBODY (ROUTINE TESTING W REFLEX)      Medicines ordered and prescription drug management: Meds ordered this encounter  Medications   salicylic acid 17 % gel  Sig: Apply topically daily.    Dispense:  15 g    Refill:  0    -I have reviewed the patients home medicines and have made adjustments as needed  Co morbidities that complicate the patient evaluation History reviewed. No pertinent past medical history.    Dispostion: Disposition decision including need for hospitalization was considered, and patient discharged from emergency department.    Final Clinical Impression(s) / ED Diagnoses Final diagnoses:  Wart of hand     This chart was dictated using voice recognition software.  Despite best efforts to proofread,  errors can occur which can change the documentation meaning.    Cristie Hem, MD 02/17/22 1016

## 2022-02-18 LAB — RPR: RPR Ser Ql: NONREACTIVE

## 2022-03-08 ENCOUNTER — Ambulatory Visit: Payer: Self-pay | Admitting: Nurse Practitioner

## 2022-03-16 ENCOUNTER — Encounter (HOSPITAL_BASED_OUTPATIENT_CLINIC_OR_DEPARTMENT_OTHER): Payer: Self-pay

## 2022-04-16 ENCOUNTER — Emergency Department (HOSPITAL_BASED_OUTPATIENT_CLINIC_OR_DEPARTMENT_OTHER)
Admission: EM | Admit: 2022-04-16 | Discharge: 2022-04-16 | Disposition: A | Discharge status: Home / Self Care | Payer: 59 | Attending: Emergency Medicine | Admitting: Emergency Medicine

## 2022-04-16 ENCOUNTER — Other Ambulatory Visit: Payer: Self-pay

## 2022-04-16 DIAGNOSIS — H6122 Impacted cerumen, left ear: Secondary | ICD-10-CM | POA: Insufficient documentation

## 2022-04-16 DIAGNOSIS — H9192 Unspecified hearing loss, left ear: Secondary | ICD-10-CM | POA: Diagnosis present

## 2022-04-16 NOTE — ED Provider Notes (Signed)
[facility] Provider Note   CSN: [id number] Arrival date & time: 04/16/22  0350     History  Chief Complaint  Patient presents with   L ear hearing loss    Melinda Garrett is a 27 y.o. female.  Muffled and decreased hearing to the left ear.  States went to sleep about 1:30 AM and was hearing well.  Did do earwax removal attempt earlier in the day with peroxide solution through syringe.  No bleeding or drainage.  No fever, chills, nausea or vomiting.  No chest pain or shortness of breath.  No pain.  The history is provided by the patient.       Home Medications Prior to Admission medications   Medication Sig Start Date End Date Taking? Authorizing Provider  acetaminophen (TYLENOL 8 HOUR) 650 MG CR tablet Take 1 tablet (650 mg total) by mouth every 8 (eight) hours as needed for pain. 05/15/17   Varney Biles, MD  ibuprofen (ADVIL,MOTRIN) 600 MG tablet Take 1 tab PO Q6h x 2 days then Q6h prn 05/15/17   Varney Biles, MD  salicylic acid 17 % gel Apply topically daily. 02/17/22   Cristie Hem, MD      Allergies    Patient has no known allergies.    Review of Systems   Review of Systems  Constitutional:  Negative for activity change and appetite change.  HENT:  Positive for ear pain.   Respiratory:  Negative for cough, chest tightness and shortness of breath.   Cardiovascular:  Negative for chest pain.  Gastrointestinal:  Negative for abdominal pain, nausea and vomiting.  Genitourinary:  Negative for dysuria and hematuria.  Musculoskeletal:  Negative for arthralgias and myalgias.  Skin:  Negative for rash.  Neurological:  Negative for dizziness, weakness and headaches.   all other systems are negative except as noted in the HPI and PMH.    Physical Exam Updated Vital Signs BP (!) 149/104   Pulse 87   Temp 97.7 F (36.5 C) (Oral)   Resp 18   Wt 122.5 kg   SpO2 100%   BMI 43.58 kg/m  Physical Exam Vitals and  nursing note reviewed.  Constitutional:      General: She is not in acute distress.    Appearance: She is well-developed.  HENT:     Head: Normocephalic and atraumatic.     Right Ear: There is impacted cerumen.     Left Ear: There is impacted cerumen.     Ears:     Comments: Cerumen impaction bilaterally, unable to visualize TM.  No tragus or mastoid pain    Mouth/Throat:     Pharynx: No oropharyngeal exudate.  Eyes:     Conjunctiva/sclera: Conjunctivae normal.     Pupils: Pupils are equal, round, and reactive to light.  Neck:     Comments: No meningismus. Cardiovascular:     Rate and Rhythm: Normal rate and regular rhythm.     Heart sounds: Normal heart sounds. No murmur heard. Pulmonary:     Effort: Pulmonary effort is normal. No respiratory distress.     Breath sounds: Normal breath sounds.  Chest:     Chest wall: No tenderness.  Abdominal:     Palpations: Abdomen is soft.     Tenderness: There is no abdominal tenderness. There is no guarding or rebound.  Musculoskeletal:        General: No tenderness. Normal range of motion.     Cervical back:  Normal range of motion and neck supple.  Skin:    General: Skin is warm.  Neurological:     Mental Status: She is alert and oriented to person, place, and time.     Cranial Nerves: No cranial nerve deficit.     Motor: No abnormal muscle tone.     Coordination: Coordination normal.     Comments:  5/5 strength throughout. CN 2-12 intact.Equal grip strength.   Psychiatric:        Behavior: Behavior normal.     ED Results / Procedures / Treatments   Labs (all labs ordered are listed, but only abnormal results are displayed) Labs Reviewed - No data to display  EKG None  Radiology No results found.  Procedures Procedures    Medications Ordered in ED Medications - No data to display  ED Course/ Medical Decision Making/ A&P                             Medical Decision Making Amount and/or Complexity of Data  Reviewed Labs: ordered. Decision-making details documented in ED Course. Radiology: ordered and independent interpretation performed. Decision-making details documented in ED Course. ECG/medicine tests: ordered and independent interpretation performed. Decision-making details documented in ED Course.   Decreased hearing left ear likely secondary to cerumen impaction.  Consider possible tympanic membrane perforation due to recent manipulation and flushing at home.  Ear irrigation was performed by nursing staff with good success.  Hearing has normalized. Tympanic membrane appears normal to inspection without evidence of perforation.  Follow-up with ENT.  Return precautions discussed.        Final Clinical Impression(s) / ED Diagnoses Final diagnoses:  Impacted cerumen of left ear    Rx / DC Orders ED Discharge Orders     None         Jonasia Coiner, Annie Main, MD 04/16/22 [phone]

## 2022-04-16 NOTE — Discharge Instructions (Signed)
Do not put anything in your ears.  Follow-up with the ear nose and throat doctor.  Return to the ED with new or worsening symptoms.

## 2022-04-16 NOTE — ED Notes (Signed)
Patient verbalizes understanding of discharge instructions. Opportunity for questioning and answers were provided. Armband removed by staff, pt discharged from ED. Ambulated out to lobby  

## 2022-04-16 NOTE — ED Triage Notes (Signed)
Pt in with "inability to hear out of left ear when I woke up". Pt denies any recent sickness, but does states she did do a half water/half peroxide earwash last evening for wax buildup. Denies any other symptoms, or ear pain

## 2022-06-22 ENCOUNTER — Ambulatory Visit: Payer: 59 | Attending: Nurse Practitioner | Admitting: Nurse Practitioner

## 2022-06-22 ENCOUNTER — Encounter: Payer: Self-pay | Admitting: Nurse Practitioner

## 2022-06-22 VITALS — BP 109/77 | HR 83 | Ht 66.75 in | Wt 291.8 lb

## 2022-06-22 DIAGNOSIS — Z7689 Persons encountering health services in other specified circumstances: Secondary | ICD-10-CM

## 2022-06-22 DIAGNOSIS — Z6841 Body Mass Index (BMI) 40.0 and over, adult: Secondary | ICD-10-CM | POA: Diagnosis not present

## 2022-06-22 DIAGNOSIS — M79661 Pain in right lower leg: Secondary | ICD-10-CM

## 2022-06-22 DIAGNOSIS — M25552 Pain in left hip: Secondary | ICD-10-CM | POA: Diagnosis not present

## 2022-06-22 DIAGNOSIS — Z23 Encounter for immunization: Secondary | ICD-10-CM

## 2022-06-22 MED ORDER — DICLOFENAC SODIUM 1 % EX GEL
2.0000 g | Freq: Four times a day (QID) | CUTANEOUS | 2 refills | Status: DC
Start: 2022-06-22 — End: 2023-08-10

## 2022-06-22 NOTE — Progress Notes (Signed)
Assessment & Plan:  [name] was seen today for establish care.  Diagnoses and all orders for this visit:  Encounter to establish care  Left hip pain -     Ambulatory referral to Orthopedics -     diclofenac Sodium (VOLTAREN) 1 % GEL; Apply 2 g topically 4 (four) times daily. Work on losing weight to help reduce joint pain. May alternate with heat and ice application for pain relief. May also alternate with acetaminophen and Ibuprofen as prescribed pain relief. Other alternatives include massage, acupuncture and water aerobics.  You must stay active and avoid a sedentary lifestyle.   Pain in right shin -     diclofenac Sodium (VOLTAREN) 1 % GEL; Apply 2 g topically 4 (four) times daily.  Need for Tdap vaccination -     Tdap vaccine greater than or equal to 7yo IM  Morbid obesity with BMI of 45.0-49.9, adult (HCC) BMI >46.  Instructions: You must burn more calories than you eat. Losing 5 percent of your body weight should be considered a success. In the longer term, losing more than 15 percent of your body weight and staying at this weight is an extremely good result. However, keep in mind that even losing 5 percent of your body weight leads to important health benefits, so try not to get discouraged if you're not able to lose more than this.     Patient has been counseled on age-appropriate routine health concerns for screening and prevention. These are reviewed and up-to-date. Referrals have been placed accordingly. Immunizations are up-to-date or declined.    Subjective:   Chief Complaint  Patient presents with   Establish Care   HPI Melinda Garrett 27 y.o. female presents to office today to establish care.   She has a PMH of Morbid obesity (BMI 46). NO DM, thyroid d/o or HTN  Blood pressure is well controlled.  BP Readings from Last 3 Encounters:  06/22/22 109/77  04/16/22 (!) 149/104  02/17/22 126/82    Weight is up over 20lbs. She does strengthening and  weightlifting.   Wt Readings from Last 3 Encounters:  06/22/22 291 lb 12.8 oz (132.4 kg)  04/16/22 270 lb (122.5 kg)  12/21/21 270 lb (122.5 kg)     Left hip pain Chronic. Worse with prolonged standing. Relieving factors: sitting. Pain described as sharp. Associated symptoms giving out. Xray in November was normal. Her weight may likely be contributing to her hip pain. She works 2nd shift Monday through Friday. She takes aleve for pain. Pain only lasts for a few minutes and then resolves.  Associated symptoms: left knee pain that occurs at night. Pain described as stabbing sensation but goes away after she gets up to stand.   She reports bilateral Shin pain.  Has tried shin splints in the past with no relief.     Review of Systems  Constitutional:  Negative for fever, malaise/fatigue and weight loss.  HENT: Negative.  Negative for nosebleeds.   Eyes: Negative.  Negative for blurred vision, double vision and photophobia.  Respiratory: Negative.  Negative for cough and shortness of breath.   Cardiovascular: Negative.  Negative for chest pain, palpitations and leg swelling.  Gastrointestinal: Negative.  Negative for heartburn, nausea and vomiting.  Musculoskeletal:  Positive for joint pain. Negative for myalgias.  Neurological: Negative.  Negative for dizziness, focal weakness, seizures and headaches.  Psychiatric/Behavioral: Negative.  Negative for suicidal ideas.     No past medical history on file.  Past Surgical  History:  Procedure Laterality Date   umbilical surgery      Family History  Problem Relation Age of Onset   Seizures Sister        1 sister has sz, CP, Dev Delays   Cerebral palsy Sister    Other Sister    Autism Cousin        Maternal second cousin has Autism    Social History Reviewed with no changes to be made today.   Outpatient Medications Prior to Visit  Medication Sig Dispense Refill   acetaminophen (TYLENOL 8 HOUR) 650 MG CR tablet Take 1 tablet (650  mg total) by mouth every 8 (eight) hours as needed for pain. 30 tablet 0   ibuprofen (ADVIL,MOTRIN) 600 MG tablet Take 1 tab PO Q6h x 2 days then Q6h prn 30 tablet 0   salicylic acid 17 % gel Apply topically daily. 15 g 0   No facility-administered medications prior to visit.    No Known Allergies     Objective:    BP 109/77 (BP Location: Left Arm, Patient Position: Sitting, Cuff Size: Large)   Pulse 83   Ht 5' 6.75" (1.695 m)   Wt 291 lb 12.8 oz (132.4 kg)   LMP 06/06/2022 (Exact Date)   SpO2 100%   BMI 46.05 kg/m  Wt Readings from Last 3 Encounters:  06/22/22 291 lb 12.8 oz (132.4 kg)  04/16/22 270 lb (122.5 kg)  12/21/21 270 lb (122.5 kg)    Physical Exam Vitals and nursing note reviewed.  Constitutional:      Appearance: She is well-developed. She is obese.  HENT:     Head: Normocephalic and atraumatic.  Cardiovascular:     Rate and Rhythm: Normal rate and regular rhythm.     Heart sounds: Normal heart sounds. No murmur heard.    No friction rub. No gallop.  Pulmonary:     Effort: Pulmonary effort is normal. No tachypnea or respiratory distress.     Breath sounds: Normal breath sounds. No decreased breath sounds, wheezing, rhonchi or rales.  Chest:     Chest wall: No tenderness.  Abdominal:     General: Bowel sounds are normal.     Palpations: Abdomen is soft.  Musculoskeletal:        General: Normal range of motion.     Cervical back: Normal range of motion.  Skin:    General: Skin is warm and dry.  Neurological:     Mental Status: She is alert and oriented to person, place, and time.     Coordination: Coordination normal.  Psychiatric:        Behavior: Behavior normal. Behavior is cooperative.        Thought Content: Thought content normal.        Judgment: Judgment normal.          Patient has been counseled extensively about nutrition and exercise as well as the importance of adherence with medications and regular follow-up. The patient was given  clear instructions to go to ER or return to medical center if symptoms don't improve, worsen or new problems develop. The patient verbalized understanding.   Follow-up: Return for physical and labs in 2-3 months.   Claiborne Rigg, FNP-BC [facility] [place], [place] [phone]   06/27/2022, 9:31 PM

## 2022-06-22 NOTE — Progress Notes (Signed)
Patient states that she has had pain in lower back on left side.  Knee pain Shin pain

## 2022-06-24 ENCOUNTER — Ambulatory Visit: Payer: 59 | Admitting: Orthopaedic Surgery

## 2022-06-25 ENCOUNTER — Ambulatory Visit: Payer: 59 | Admitting: Orthopaedic Surgery

## 2022-06-27 ENCOUNTER — Encounter: Payer: Self-pay | Admitting: Nurse Practitioner

## 2022-07-23 ENCOUNTER — Encounter (HOSPITAL_BASED_OUTPATIENT_CLINIC_OR_DEPARTMENT_OTHER): Payer: Self-pay | Admitting: Emergency Medicine

## 2022-07-23 ENCOUNTER — Emergency Department (HOSPITAL_BASED_OUTPATIENT_CLINIC_OR_DEPARTMENT_OTHER)
Admission: EM | Admit: 2022-07-23 | Discharge: 2022-07-23 | Disposition: A | Discharge status: Home / Self Care | Payer: 59 | Attending: Emergency Medicine | Admitting: Emergency Medicine

## 2022-07-23 ENCOUNTER — Other Ambulatory Visit: Payer: Self-pay

## 2022-07-23 DIAGNOSIS — R42 Dizziness and giddiness: Secondary | ICD-10-CM | POA: Insufficient documentation

## 2022-07-23 DIAGNOSIS — R519 Headache, unspecified: Secondary | ICD-10-CM | POA: Diagnosis not present

## 2022-07-23 LAB — CBC
HCT: 38.2 % (ref 36.0–46.0)
Hemoglobin: 12.5 g/dL (ref 12.0–15.0)
MCH: 28.8 pg (ref 26.0–34.0)
MCHC: 32.7 g/dL (ref 30.0–36.0)
MCV: 88 fL (ref 80.0–100.0)
Platelets: 266 10*3/uL (ref 150–400)
RBC: 4.34 MIL/uL (ref 3.87–5.11)
RDW: 13.6 % (ref 11.5–15.5)
WBC: 5.6 10*3/uL (ref 4.0–10.5)
nRBC: 0 % (ref 0.0–0.2)

## 2022-07-23 LAB — BASIC METABOLIC PANEL
Anion gap: 8 (ref 5–15)
BUN: 11 mg/dL (ref 6–20)
CO2: 25 mmol/L (ref 22–32)
Calcium: 8.8 mg/dL — ABNORMAL LOW (ref 8.9–10.3)
Chloride: 105 mmol/L (ref 98–111)
Creatinine, Ser: 0.81 mg/dL (ref 0.44–1.00)
GFR, Estimated: >60 mL/min (ref >60)
Glucose, Bld: 107 mg/dL — ABNORMAL HIGH (ref 70–99)
Potassium: 3.9 mmol/L (ref 3.5–5.1)
Sodium: 138 mmol/L (ref 135–145)

## 2022-07-23 MED ORDER — MECLIZINE HCL 25 MG PO TABS: 25.0000 mg | ORAL_TABLET | Freq: Three times a day (TID) | ORAL | 0 refills | Status: DC | PRN

## 2022-07-23 MED ORDER — MECLIZINE HCL 25 MG PO TABS
25.0000 mg | ORAL_TABLET | Freq: Once | ORAL | Status: AC
  Administered 2022-07-23: 25 mg via ORAL
  Filled 2022-07-23: qty 1

## 2022-07-23 NOTE — ED Triage Notes (Addendum)
Pt arrives to ED with c/o dizziness and headache that typically start in the mornings over the past several weeks. Partner notes that she pt snores at night.

## 2022-07-23 NOTE — ED Notes (Signed)
Patient verbalizes understanding of discharge instructions. Opportunity for questioning and answers were provided. Patient discharged from ED.  °

## 2022-07-23 NOTE — ED Provider Notes (Signed)
[facility] AT [facility] Provider Note   CSN: [id number] Arrival date & time: 07/23/22  1002     History  Chief Complaint  Patient presents with   Headache    Melinda Garrett is a 27 y.o. female otherwise healthy presents today for evaluation of headache.  Patient reports that she has been having headache and dizziness in the last 2 to 3 weeks.  States symptoms usually got worse in the morning after she wakes up and upon standing up.  She described it as room spinning sensation and pain on the right side of her head.  No history of migraines.  Denies any fever, vision changes, hearing loss, nausea, vomiting, chest pain or shortness of breath.  Denies any blood in her stool or urine.  Denies any recent trauma or head injury.  States she has tried Tylenol and ibuprofen at home which helped initially but symptoms returned.   Headache    History reviewed. No pertinent past medical history. Past Surgical History:  Procedure Laterality Date   umbilical surgery       Home Medications Prior to Admission medications   Medication Sig Start Date End Date Taking? Authorizing Provider  acetaminophen (TYLENOL 8 HOUR) 650 MG CR tablet Take 1 tablet (650 mg total) by mouth every 8 (eight) hours as needed for pain. 05/15/17   Derwood Kaplan, MD  diclofenac Sodium (VOLTAREN) 1 % GEL Apply 2 g topically 4 (four) times daily. 06/22/22   Claiborne Rigg, NP      Allergies    Patient has no known allergies.    Review of Systems   Review of Systems  Neurological:  Positive for headaches.    Physical Exam Updated Vital Signs BP 132/84 (BP Location: Right Arm)   Pulse 85   Temp 98.4 F (36.9 C) (Oral)   Resp 18   Ht 5\' 6"  (1.676 m)   Wt 132.9 kg   SpO2 99%   BMI 47.29 kg/m  Physical Exam Vitals and nursing note reviewed.  Constitutional:      Appearance: Normal appearance.  HENT:     Head: Normocephalic and atraumatic.     Mouth/Throat:     Mouth:  Mucous membranes are moist.  Eyes:     General: No scleral icterus. Cardiovascular:     Rate and Rhythm: Normal rate and regular rhythm.     Pulses: Normal pulses.     Heart sounds: Normal heart sounds.  Pulmonary:     Effort: Pulmonary effort is normal.     Breath sounds: Normal breath sounds.  Abdominal:     General: Abdomen is flat.     Palpations: Abdomen is soft.     Tenderness: There is no abdominal tenderness.  Musculoskeletal:        General: No deformity.  Skin:    General: Skin is warm.     Findings: No rash.  Neurological:     General: No focal deficit present.     Mental Status: She is alert.     Comments: Cranial nerves II through XII intact. Intact sensation to light touch in all 4 extremities. 5/5 strength in all 4 extremities. Intact finger-to-nose and heel-to-shin of all 4 extremities. No visual field cuts. No neglect noted. No aphasia noted.   Psychiatric:        Mood and Affect: Mood normal.     ED Results / Procedures / Treatments   Labs (all labs ordered are listed, but only abnormal  results are displayed) Labs Reviewed  CBC  BASIC METABOLIC PANEL    EKG None  Radiology No results found.  Procedures Procedures    Medications Ordered in ED Medications - No data to display  ED Course/ Medical Decision Making/ A&P                             Medical Decision Making Amount and/or Complexity of Data Reviewed Labs: ordered.   This patient presents to the ED for headache, dizziness, this involves an extensive number of treatment options, and is a complaint that carries with a high risk of complications and morbidity.  The differential diagnosis includes CVA, ICH, multiple sclerosis, acoustic neuroma, carotid artery dissection, BPPV, labyrinthitis, Mnire's.  This is not an exhaustive list.  Problem list/ ED course/ Critical interventions/ Medical management: HPI: See above Vital signs within normal range and stable throughout  visit. Laboratory/imaging studies significant for: See above. On physical examination, patient is afebrile and appears in no acute distress. This patient presents with a chief complaint of dizziness, most consistent with a peripheral cause, likely BPPV. No history of recent infection so doubt vestibular neuritis. History not consistent with meniere's disease. No history of trauma. No red flag features for central vertigo to include gradual onset, vertical/bidirectional or non-fatigable nystagmus, focal neurologic findings on exam (including inability to ambulate, ataxia, dysmetria). Presentation not consistent with an acute CNS infection, vertebral basilar artery insufficiency, cerebellar hemorrhage or infarction, intracranial mass or bleed.  Sent an Rx of meclizine. Advised patient to take Tylenol/ibuprofen/naproxen for pain, follow-up with primary care physician or neurology for further evaluation and management, return to the ER if new or worsening symptoms.  I have reviewed the patient home medicines and have made adjustments as needed.  Cardiac monitoring/EKG: The patient was maintained on a cardiac monitor.  I personally reviewed and interpreted the cardiac monitor which showed an underlying rhythm of: sinus rhythm.  Additional history obtained: External records from outside source obtained and reviewed including: Chart review including previous notes, labs, imaging.  Consultations obtained:  Disposition Continued outpatient therapy. Follow-up with PCP recommended for reevaluation of symptoms. Treatment plan discussed with patient.  Pt acknowledged understanding was agreeable to the plan. Worrisome signs and symptoms were discussed with patient, and patient acknowledged understanding to return to the ED if they noticed these signs and symptoms. Patient was stable upon discharge.   This chart was dictated using voice recognition software.  Despite best efforts to proofread,  errors can occur  which can change the documentation meaning.          Final Clinical Impression(s) / ED Diagnoses Final diagnoses:  Dizziness  Vertigo    Rx / DC Orders ED Discharge Orders          Ordered    meclizine (ANTIVERT) 25 MG tablet  3 times daily PRN        07/23/22 1128              Jeanelle Malling, [place] 07/23/22 1132    Blane Ohara, MD 07/23/22 [phone]

## 2022-07-23 NOTE — Discharge Instructions (Addendum)
Please take your medications as prescribed. Take tylenol/ibuprofen up to 3 times a day for pain. I recommend close follow-up with PCP and neurology for reevaluation.  Please do not hesitate to return to emergency department if worrisome signs symptoms we discussed become apparent.

## 2022-07-27 ENCOUNTER — Encounter: Payer: Self-pay | Admitting: Nurse Practitioner

## 2022-07-27 ENCOUNTER — Ambulatory Visit: Payer: 59 | Attending: Nurse Practitioner | Admitting: Nurse Practitioner

## 2022-07-27 VITALS — BP 113/74 | HR 75 | Ht 66.0 in | Wt 298.0 lb

## 2022-07-27 DIAGNOSIS — G4733 Obstructive sleep apnea (adult) (pediatric): Secondary | ICD-10-CM

## 2022-07-27 DIAGNOSIS — G43009 Migraine without aura, not intractable, without status migrainosus: Secondary | ICD-10-CM | POA: Diagnosis not present

## 2022-07-27 DIAGNOSIS — Z09 Encounter for follow-up examination after completed treatment for conditions other than malignant neoplasm: Secondary | ICD-10-CM | POA: Diagnosis not present

## 2022-07-27 MED ORDER — SUMATRIPTAN SUCCINATE 25 MG PO TABS
25.0000 mg | ORAL_TABLET | Freq: Once | ORAL | 0 refills | Status: AC
Start: 2022-07-27 — End: 2022-07-27

## 2022-07-27 NOTE — Progress Notes (Signed)
Assessment & Plan:  Melinda Garrett was seen today for hospital visit follow up.  Diagnoses and all orders for this visit:  Hospital discharge follow-up  Obstructive sleep apnea syndrome -     Home sleep test; Future  Migraine without aura and without status migrainosus, not intractable Recommend food diary with logging of past 24 hours food and drink intake when headaches occur. -     SUMAtriptan (IMITREX) 25 MG tablet; Take 1-2 tablets (25-50 mg total) by mouth once for 1 dose. May repeat in 2 hours if headache persists or recurs. For headaches    Patient has been counseled on age-appropriate routine health concerns for screening and prevention. These are reviewed and up-to-date. Referrals have been placed accordingly. Immunizations are up-to-date or declined.    Subjective:   Chief Complaint  Patient presents with   Hospital Visit Follow Up   HPI Melinda Garrett 27 y.o. female presents to office today for HFU for dizziness.   Ms Dlugos was evaluated in the ED on 07-23-2022 for headaches and dizziness with onset 2 to 3 weeks prior to being seen in the emergency room.   Per ED note:  States symptoms usually got worse in the morning after she wakes up and upon standing up.  She described it as room spinning sensation and pain on the right side of her head.  No history of migraines.  Denies any fever, vision changes, hearing loss, nausea, vomiting, chest pain or shortness of breath.  Denies any blood in her stool or urine.  Denies any recent trauma or head injury.  States she has tried Tylenol and ibuprofen at home which helped initially but symptoms returned.  Today patient reports she was experiencing a headache but not specifically experiencing dizziness or vertigo when she went to the emergency room.  States she was instructed that her headaches could be related to sleep apnea and for her to speak to her PCP about this.  States last headache was 3 days ago and headaches come and go and  not occurring daily.  Sleep Apnea: Patient presents with possible obstructive sleep apnea. Patent has symptoms of morning fatigue and morning headache. Patient generally gets 6 or 7 hours of sleep per night, and states they generally have nightime awakenings. Snoring of moderate severity is present. Apneic episodes are not known to be present. Nasal obstruction is not present.  Patient has not had tonsillectomy. She has a family history of OSA: mother.  Snoring has gotten worse since she has gained weight over the past year.            Review of Systems  Constitutional:  Negative for fever, malaise/fatigue and weight loss.  HENT: Negative.  Negative for nosebleeds.   Eyes: Negative.  Negative for blurred vision, double vision and photophobia.  Respiratory: Negative.  Negative for cough and shortness of breath.   Cardiovascular: Negative.  Negative for chest pain, palpitations and leg swelling.  Gastrointestinal: Negative.  Negative for heartburn, nausea and vomiting.  Musculoskeletal: Negative.  Negative for myalgias.  Neurological:  Positive for headaches. Negative for dizziness, focal weakness and seizures.  Psychiatric/Behavioral: Negative.  Negative for suicidal ideas.     No past medical history on file.  Past Surgical History:  Procedure Laterality Date   umbilical surgery      Family History  Problem Relation Age of Onset   Seizures Sister        1 sister has sz, CP, Dev Delays   Cerebral palsy  Sister    Other Sister    Autism Cousin        Maternal second cousin has Autism    Social History Reviewed with no changes to be made today.   Outpatient Medications Prior to Visit  Medication Sig Dispense Refill   diclofenac Sodium (VOLTAREN) 1 % GEL Apply 2 g topically 4 (four) times daily. 200 g 2   naproxen sodium (ALEVE) 220 MG tablet Take 220 mg by mouth.     acetaminophen (TYLENOL 8 HOUR) 650 MG CR tablet Take 1 tablet (650 mg total) by mouth every 8 (eight)  hours as needed for pain. (Patient not taking: Reported on 07/27/2022) 30 tablet 0   meclizine (ANTIVERT) 25 MG tablet Take 1 tablet (25 mg total) by mouth 3 (three) times daily as needed for dizziness. (Patient not taking: Reported on 07/27/2022) 30 tablet 0   No facility-administered medications prior to visit.    No Known Allergies     Objective:    BP 113/74 (BP Location: Left Arm, Patient Position: Sitting, Cuff Size: Large)   Pulse 75   Ht 5\' 6"  (1.676 m)   Wt 298 lb (135.2 kg)   LMP 06/06/2022   SpO2 99%   BMI 48.10 kg/m  Wt Readings from Last 3 Encounters:  07/27/22 298 lb (135.2 kg)  07/23/22 293 lb (132.9 kg)  06/22/22 291 lb 12.8 oz (132.4 kg)    Physical Exam Vitals and nursing note reviewed.  Constitutional:      Appearance: She is well-developed. She is obese.  HENT:     Head: Normocephalic and atraumatic.  Cardiovascular:     Rate and Rhythm: Normal rate and regular rhythm.     Heart sounds: Normal heart sounds. No murmur heard.    No friction rub. No gallop.  Pulmonary:     Effort: Pulmonary effort is normal. No tachypnea or respiratory distress.     Breath sounds: Normal breath sounds. No decreased breath sounds, wheezing, rhonchi or rales.  Chest:     Chest wall: No tenderness.  Abdominal:     General: Bowel sounds are normal.     Palpations: Abdomen is soft.  Musculoskeletal:        General: Normal range of motion.     Cervical back: Normal range of motion.  Skin:    General: Skin is warm and dry.  Neurological:     Mental Status: She is alert and oriented to person, place, and time.     Coordination: Coordination normal.  Psychiatric:        Behavior: Behavior normal. Behavior is cooperative.        Thought Content: Thought content normal.        Judgment: Judgment normal.          Patient has been counseled extensively about nutrition and exercise as well as the importance of adherence with medications and regular follow-up. The patient  was given clear instructions to go to ER or return to medical center if symptoms don't improve, worsen or new problems develop. The patient verbalized understanding.   Follow-up: Return if symptoms worsen or fail to improve.   Claiborne Rigg, FNP-BC [facility] [place], [place] [phone]   07/27/2022, 11:32 AM

## 2022-08-20 ENCOUNTER — Ambulatory Visit: Payer: 59 | Admitting: Orthopaedic Surgery

## 2022-09-03 ENCOUNTER — Ambulatory Visit (HOSPITAL_BASED_OUTPATIENT_CLINIC_OR_DEPARTMENT_OTHER): Payer: 59 | Attending: Nurse Practitioner | Admitting: Internal Medicine

## 2022-09-03 ENCOUNTER — Ambulatory Visit (INDEPENDENT_AMBULATORY_CARE_PROVIDER_SITE_OTHER): Payer: 59 | Admitting: Orthopaedic Surgery

## 2022-09-03 DIAGNOSIS — M545 Low back pain, unspecified: Secondary | ICD-10-CM

## 2022-09-03 DIAGNOSIS — G8929 Other chronic pain: Secondary | ICD-10-CM | POA: Diagnosis not present

## 2022-09-03 DIAGNOSIS — Z6841 Body Mass Index (BMI) 40.0 and over, adult: Secondary | ICD-10-CM

## 2022-09-03 NOTE — Progress Notes (Signed)
Office Visit Note   Patient: Melinda Garrett           Date of Birth: [birth year]           MRN: [id number] Visit Date: 09/03/2022              Requested by: Claiborne Rigg, NP [address] [place],  [place] [address] PCP: Claiborne Rigg, NP   Assessment & Plan: Visit Diagnoses:  1. Chronic left-sided low back pain without sciatica   2. Body mass index 40.0-44.9, adult [facility])     Plan: Patient is a 27 year old female with chronic left-sided low back pain.  Aleve is effective for her symptoms.  I have made a referral to outpatient physical therapy and home exercise program.  We had a discussion on weight loss and how this would likely affect her symptoms.  Follow-Up Instructions: No follow-ups on file.   Orders:  Orders Placed This Encounter  Procedures   Ambulatory referral to Physical Therapy   No orders of the defined types were placed in this encounter.     Procedures: No procedures performed   Clinical Data: No additional findings.   Subjective: Chief Complaint  Patient presents with   Left Hip - Pain    HPI [name] is a 27 year old female here for evaluation of left hip and lower back pain.  Pain started a couple years ago without any preceding injuries or changes in activity.  She feels that her leg gives out if she overdoes it.  Denies any radicular symptoms.  Denies any groin pain.  Review of Systems  Constitutional: Negative.   HENT: Negative.    Eyes: Negative.   Respiratory: Negative.    Cardiovascular: Negative.   Endocrine: Negative.   Musculoskeletal: Negative.   Neurological: Negative.   Hematological: Negative.   Psychiatric/Behavioral: Negative.    All other systems reviewed and are negative.    Objective: Vital Signs: There were no vitals taken for this visit.  Physical Exam Vitals and nursing note reviewed.  Constitutional:      Appearance: She is well-developed.  HENT:     Head: Atraumatic.     Nose: Nose  normal.  Eyes:     Extraocular Movements: Extraocular movements intact.  Cardiovascular:     Pulses: Normal pulses.  Pulmonary:     Effort: Pulmonary effort is normal.  Abdominal:     Palpations: Abdomen is soft.  Musculoskeletal:     Cervical back: Neck supple.  Skin:    General: Skin is warm.     Capillary Refill: Capillary refill takes less than 2 seconds.  Neurological:     Mental Status: She is alert. Mental status is at baseline.  Psychiatric:        Behavior: Behavior normal.        Thought Content: Thought content normal.        Judgment: Judgment normal.     Ortho Exam Examination of left hip shows no pain with logroll or movement of the hip joint.  No sign of tension signs.  No trochanteric tenderness.  Pain localizes to the left lower back region. Specialty Comments:  No specialty comments available.  Imaging: No results found.   PMFS History: Patient Active Problem List   Diagnosis Date Noted   Vertigo 10/23/2012   No past medical history on file.  Family History  Problem Relation Age of Onset   Seizures Sister        1  sister has sz, CP, Dev Delays   Cerebral palsy Sister    Other Sister    Autism Cousin        Maternal second cousin has Autism    Past Surgical History:  Procedure Laterality Date   umbilical surgery     Social History   Occupational History   Not on file  Tobacco Use   Smoking status: Former    Types: Cigars   Smokeless tobacco: Never  Vaping Use   Vaping Use: Never used  Substance and Sexual Activity   Alcohol use: Not Currently   Drug use: Never   Sexual activity: Never

## 2022-09-23 NOTE — Therapy (Addendum)
OUTPATIENT PHYSICAL THERAPY EVALUATION  DISCHARGE   Patient Name: Melinda Garrett MRN: [id number] DOB:03/16/1995, 27 y.o., female Today's Date: 09/24/2022   END OF SESSION:  PT End of Session - 09/24/22 1102     Visit Number 1    Number of Visits 9    Date for PT Re-Evaluation 11/19/22    Authorization Type [facility] [employer] / Rolene Arbour    PT Start Time 1100    PT Stop Time 1145    PT Time Calculation (min) 45 min    Activity Tolerance Patient tolerated treatment well    Behavior During Therapy [facility] for tasks assessed/performed             History reviewed. No pertinent past medical history. Past Surgical History:  Procedure Laterality Date   umbilical surgery     Patient Active Problem List   Diagnosis Date Noted   Vertigo 10/23/2012    PCP: Claiborne Rigg, NP  REFERRING PROVIDER: Tarry Kos, MD  REFERRING DIAG: Chronic right-sided low back pain without sciatica  Rationale for Evaluation and Treatment: Rehabilitation  THERAPY DIAG:  Other low back pain  Muscle weakness (generalized)  ONSET DATE: Ongoing since 2022   SUBJECTIVE:                                                                                                                                                                                          SUBJECTIVE STATEMENT: Patient reports whenever she is moving fast she starts having pain on the left side of the lower back. Has been going on for a while, since 2022. 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 If she is working now doing any mopping then she will have to take a break because it will start hurting and feel like the left side is going to give out. Her doctor gave her some cream and she takes Aleve but it doesn't do anything for the pain. She feels like she pulled something and it just hasn't gotten better. She has trouble doing things like lifting, mopping, vacuuming, laundry. If she  pushes through the pain is when like the whole left side will give out on her. If she rests for about 30 seconds the pain will go away but the pain will come back once she starts moving again.  PERTINENT HISTORY:  See PMH above  PAIN:  Are you having pain? Yes:  NPRS scale: 0/10 (10/10 at worst) Pain location: Left lower back Pain description: Intense sharp pain Aggravating factors: Moving Relieving factors: Rest  PRECAUTIONS: None  RED FLAGS:  None   WEIGHT BEARING RESTRICTIONS: No  FALLS:  Has patient fallen in last 6 months? 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  PLOF: Independent  PATIENT GOALS: Pain relief   OBJECTIVE:  PATIENT SURVEYS:  FOTO 53% functional status  COGNITION: Overall cognitive status: Within functional limits for tasks assessed     SENSATION: WFL  MUSCLE LENGTH: Limitation with bilateral hamstring, quad, hip flexor  POSTURE:   Rounded shoulders  PALPATION: Tender to palpation left lower lumbar paraspinals  LUMBAR ROM:   AROM eval  Flexion WFL  Extension WFL  Right lateral flexion WFL  Left lateral flexion WFL  Right rotation WFL  Left rotation WFL   (Blank rows = not tested)  LOWER EXTREMITY ROM:    LE ROM grossly WFL, patient reports left anterior hip/groin pain at end range left hip IR  LOWER EXTREMITY MMT:    MMT Right eval Left eval  Hip flexion 5 5  Hip extension 4- 4-  Hip abduction 4- 4-  Hip adduction    Hip internal rotation    Hip external rotation    Knee flexion 5 5  Knee extension 5 5  Ankle dorsiflexion    Ankle plantarflexion    Ankle inversion    Ankle eversion     (Blank rows = not tested)  LUMBAR SPECIAL TESTS:  Radicular testing negative  FUNCTIONAL TESTS:  DLLT: 45 deg Front plank: 16 sec Side plank: right 7 sec, left 5 sec  GAIT: Assistive device utilized: None Level of assistance: Complete Independence Comments: WFL   TODAY'S TREATMENT:      [facility] Adult PT Treatment:                                                 DATE: 09/24/2022 Therapeutic Exercise: Bridge 10 x 5 sec Side clamshell with green x 15 each Modified side plank on knees 3 x 10 sec each Modified front plank on knees 3 x 10 sec Pallof press with green 5 x 5 sec each  PATIENT EDUCATION:  Education details: Exam findings, POC, HEP Person educated: Patient Education method: Explanation, Demonstration, Tactile cues, Verbal cues, and Handouts Education comprehension: verbalized understanding, returned demonstration, verbal cues required, tactile cues required, and needs further education  HOME EXERCISE PROGRAM: Access Code: [email or web address]    ASSESSMENT: CLINICAL IMPRESSION: Patient is a 27 y.o. female who was seen today for physical therapy evaluation and treatment for chronic left sided low back pain. Her pain seems to be primarily muscular in nature and she demonstrates gross strength and endurance deficit of the hips and core.    OBJECTIVE IMPAIRMENTS: decreased activity tolerance, decreased strength, impaired flexibility, postural dysfunction, and pain.   ACTIVITY LIMITATIONS: carrying, lifting, standing, and locomotion level  PARTICIPATION LIMITATIONS: meal prep, cleaning, laundry, shopping, community activity, and occupation  PERSONAL FACTORS: Fitness, Past/current experiences, and Time since onset of injury/illness/exacerbation are also affecting patient's functional outcome.   REHAB POTENTIAL: Good  CLINICAL DECISION MAKING: Stable/uncomplicated  EVALUATION COMPLEXITY: Low   GOALS: Goals reviewed with patient? Yes  SHORT TERM GOALS: Target date: 10/22/2022  Patient will be I with initial HEP in order to progress with therapy. Baseline: HEP provided at eval Goal status: INITIAL  2.  Patient will report pain with activity </= 5/10 in order to reduce functional limitations Baseline: 10/10 pain  Goal status: INITIAL  LONG TERM GOALS: Target date: 11/19/2022  Patient will be I with final  HEP to maintain progress from PT. Baseline: HEP provided at eval Goal status: INITIAL  2.  Patient will report >/= 67% status on FOTO to indicate improved functional ability. Baseline: 53% functional status Goal status: INITIAL  3.  Patient will demonstrate gross hip strength >/= 4+/5 MMT in order to improve lifting tolerance Baseline: 4-/5 MMT Goal status: INITIAL  4.  Patient will be able to maintain front and side plank >/= 30 seconds in order to indicate improve core control and reduce pain with household tasks Baseline: see limitations above Goal status: INITIAL   PLAN: PT FREQUENCY: 1x/week  PT DURATION: 8 weeks  PLANNED INTERVENTIONS: Therapeutic exercises, Therapeutic activity, Neuromuscular re-education, Balance training, Gait training, Patient/Family education, Self Care, Joint mobilization, Joint manipulation, Dry Needling, Electrical stimulation, Spinal manipulation, Spinal mobilization, Cryotherapy, Moist heat, Manual therapy, and Re-evaluation.  PLAN FOR NEXT SESSION: Review HEP and progress PRN, manual/TPDN for left lumbar paraspinals, incorporate hamstring/quad/hip stretching, progress core and hip strengthening, lifting mechanics and progression   Rosana Hoes, PT, DPT, LAT, ATC 09/24/22  11:55 AM Phone: [phone] Fax: [phone]   [employer] Authorization   Choose one: Rehabilitative  Standardized Assessment or Functional Outcome Tool: FOTO  Score or Percent Disability: 53% (predicted to 67%)  Body Parts Treated (Select each separately):  Lumbopelvic. Overall deficits/functional limitations for body part selected: moderate  If treatment provided at initial evaluation, no treatment charged due to lack of authorization.     PHYSICAL THERAPY DISCHARGE SUMMARY  Visits from Start of Care: 1  Current functional level related to goals / functional outcomes: See above   Remaining deficits: See above   Education / Equipment: HEP   Patient  agrees to discharge. Patient goals were not met. Patient is being discharged due to not returning since the last visit.  Rosana Hoes, PT, DPT, LAT, ATC 12/20/22  8:45 AM Phone: [phone] Fax: [phone]

## 2022-09-24 ENCOUNTER — Other Ambulatory Visit: Payer: Self-pay

## 2022-09-24 ENCOUNTER — Ambulatory Visit: Payer: 59 | Attending: Orthopaedic Surgery | Admitting: Physical Therapy

## 2022-09-24 ENCOUNTER — Encounter: Payer: Self-pay | Admitting: Physical Therapy

## 2022-09-24 DIAGNOSIS — G8929 Other chronic pain: Secondary | ICD-10-CM | POA: Insufficient documentation

## 2022-09-24 DIAGNOSIS — M6281 Muscle weakness (generalized): Secondary | ICD-10-CM | POA: Diagnosis not present

## 2022-09-24 DIAGNOSIS — M545 Low back pain, unspecified: Secondary | ICD-10-CM | POA: Insufficient documentation

## 2022-09-24 DIAGNOSIS — M5459 Other low back pain: Secondary | ICD-10-CM | POA: Diagnosis not present

## 2022-09-24 NOTE — Patient Instructions (Signed)
Access Code: [email or web address] URL: [email or web address] Date: 09/24/2022 Prepared by: Rosana Hoes  Exercises - Bridge  - 3-4 x weekly - 2 sets - 10 reps - 5 seconds hold - Clam with Resistance  - 3-4 x weekly - 2 sets - 15 reps - Plank on Knees  - 3-4 x weekly - 5 reps - 10 seconds hold - Side Plank on Knees  - 3-4 x weekly - 5 reps - 10 seconds hold - Standing Anti-Rotation Press with Anchored Resistance  - 3-4 x weekly - 2 sets - 10 reps - 5 seconds hold

## 2022-09-28 ENCOUNTER — Encounter: Payer: 59 | Admitting: Nurse Practitioner

## 2022-10-15 ENCOUNTER — Ambulatory Visit: Payer: 59 | Admitting: Physical Therapy

## 2022-10-22 ENCOUNTER — Ambulatory Visit: Payer: 59 | Admitting: Physical Therapy

## 2022-10-29 ENCOUNTER — Ambulatory Visit: Payer: 59 | Admitting: Physical Therapy

## 2022-11-05 ENCOUNTER — Encounter: Payer: 59 | Admitting: Physical Therapy

## 2022-11-09 ENCOUNTER — Ambulatory Visit: Payer: 59 | Attending: Nurse Practitioner | Admitting: Nurse Practitioner

## 2022-11-09 ENCOUNTER — Encounter: Payer: Self-pay | Admitting: Nurse Practitioner

## 2022-11-09 VITALS — BP 113/80 | HR 83 | Ht 66.0 in | Wt 295.8 lb

## 2022-11-09 DIAGNOSIS — Z833 Family history of diabetes mellitus: Secondary | ICD-10-CM

## 2022-11-09 DIAGNOSIS — Z8349 Family history of other endocrine, nutritional and metabolic diseases: Secondary | ICD-10-CM

## 2022-11-09 DIAGNOSIS — Z0000 Encounter for general adult medical examination without abnormal findings: Secondary | ICD-10-CM

## 2022-11-09 NOTE — Progress Notes (Signed)
Assessment & Plan:  Melinda Garrett was seen today for annual exam.  Diagnoses and all orders for this visit:  Encounter for annual physical exam -     CMP14+EGFR  Family history of thyroid disease in mother -     Thyroid Panel With TSH  Family history of diabetes mellitus in mother -     Hemoglobin A1c    Patient has been counseled on age-appropriate routine health concerns for screening and prevention. These are reviewed and up-to-date. Referrals have been placed accordingly. Immunizations are up-to-date or declined.    Subjective:   Chief Complaint  Patient presents with   Annual Exam   HPI Melinda Garrett 27 y.o. female presents to office today for annual physical exam.   She is having pain in both heels which is worse when she wakes up in the mornings. Pain lessens throughout the day. Denies any injury.   She states her mother would like her thyroid and A1c level to be checked due to family history of both in mother and other family members.     Review of Systems  Constitutional:  Negative for fever, malaise/fatigue and weight loss.  HENT: Negative.  Negative for nosebleeds.   Eyes: Negative.  Negative for blurred vision, double vision and photophobia.  Respiratory: Negative.  Negative for cough and shortness of breath.   Cardiovascular: Negative.  Negative for chest pain, palpitations and leg swelling.  Gastrointestinal: Negative.  Negative for heartburn, nausea and vomiting.  Genitourinary: Negative.   Musculoskeletal: Negative.  Negative for myalgias.  Skin: Negative.   Neurological: Negative.  Negative for dizziness, focal weakness, seizures and headaches.  Endo/Heme/Allergies: Negative.   Psychiatric/Behavioral: Negative.  Negative for suicidal ideas.     History reviewed. No pertinent past medical history.  Past Surgical History:  Procedure Laterality Date   umbilical surgery      Family History  Problem Relation Age of Onset   Seizures Sister         1 sister has sz, CP, Dev Delays   Cerebral palsy Sister    Other Sister    Autism Cousin        Maternal second cousin has Autism    Social History Reviewed with no changes to be made today.   Outpatient Medications Prior to Visit  Medication Sig Dispense Refill   diclofenac Sodium (VOLTAREN) 1 % GEL Apply 2 g topically 4 (four) times daily. 200 g 2   naproxen sodium (ALEVE) 220 MG tablet Take 220 mg by mouth.     SUMAtriptan (IMITREX) 25 MG tablet Take 1-2 tablets (25-50 mg total) by mouth once for 1 dose. May repeat in 2 hours if headache persists or recurs. For headaches 10 tablet 0   No facility-administered medications prior to visit.    No Known Allergies     Objective:    BP 113/80 (BP Location: Left Arm, Patient Position: Sitting, Cuff Size: Large)   Pulse 83   Ht 5\' 6"  (1.676 m)   Wt 295 lb 12.8 oz (134.2 kg)   LMP 10/30/2022   SpO2 100%   BMI 47.74 kg/m  Wt Readings from Last 3 Encounters:  11/09/22 295 lb 12.8 oz (134.2 kg)  07/27/22 298 lb (135.2 kg)  07/23/22 293 lb (132.9 kg)    Physical Exam Constitutional:      Appearance: She is well-developed.  HENT:     Head: Normocephalic and atraumatic.     Right Ear: Hearing, tympanic membrane, ear canal and external  ear normal.     Left Ear: Hearing, tympanic membrane, ear canal and external ear normal.     Nose: Nose normal.     Right Turbinates: Not enlarged.     Left Turbinates: Not enlarged.     Mouth/Throat:     Lips: Pink.     Mouth: Mucous membranes are moist.     Dentition: No dental tenderness, gingival swelling, dental abscesses or gum lesions.     Pharynx: No oropharyngeal exudate.  Eyes:     General: No scleral icterus.       Right eye: No discharge.     Extraocular Movements: Extraocular movements intact.     Conjunctiva/sclera: Conjunctivae normal.     Pupils: Pupils are equal, round, and reactive to light.  Neck:     Thyroid: No thyromegaly.     Trachea: No tracheal deviation.   Cardiovascular:     Rate and Rhythm: Normal rate and regular rhythm.     Heart sounds: Murmur heard.     No friction rub.  Pulmonary:     Effort: Pulmonary effort is normal. No accessory muscle usage or respiratory distress.     Breath sounds: Normal breath sounds. No decreased breath sounds, wheezing, rhonchi or rales.  Abdominal:     General: Bowel sounds are normal. There is no distension.     Palpations: Abdomen is soft. There is no mass.     Tenderness: There is no abdominal tenderness. There is no right CVA tenderness, left CVA tenderness, guarding or rebound.     Hernia: No hernia is present.  Musculoskeletal:        General: No tenderness or deformity. Normal range of motion.     Cervical back: Normal range of motion and neck supple.  Lymphadenopathy:     Cervical: No cervical adenopathy.  Skin:    General: Skin is warm and dry.     Findings: No erythema.  Neurological:     Mental Status: She is alert and oriented to person, place, and time.     Cranial Nerves: No cranial nerve deficit.     Motor: Motor function is intact.     Coordination: Coordination is intact. Coordination normal.     Gait: Gait is intact.     Deep Tendon Reflexes:     Reflex Scores:      Patellar reflexes are 1+ on the right side and 1+ on the left side. Psychiatric:        Attention and Perception: Attention normal.        Mood and Affect: Mood normal.        Speech: Speech normal.        Behavior: Behavior normal.        Thought Content: Thought content normal.        Judgment: Judgment normal.          Patient has been counseled extensively about nutrition and exercise as well as the importance of adherence with medications and regular follow-up. The patient was given clear instructions to go to ER or return to medical center if symptoms don't improve, worsen or new problems develop. The patient verbalized understanding.   Follow-up: Return if symptoms worsen or fail to improve.   Claiborne Rigg, FNP-BC [facility] [place], [place] [phone]   11/09/2022, 3:08 PM

## 2022-11-10 LAB — CMP14+EGFR
ALT: 13 IU/L (ref 0–32)
AST: 16 IU/L (ref 0–40)
Albumin: 4.4 g/dL (ref 4.0–5.0)
Alkaline Phosphatase: 63 IU/L (ref 44–121)
BUN/Creatinine Ratio: 11 (ref 9–23)
BUN: 10 mg/dL (ref 6–20)
Bilirubin Total: 0.3 mg/dL (ref 0.0–1.2)
CO2: 22 mmol/L (ref 20–29)
Calcium: 9.7 mg/dL (ref 8.7–10.2)
Chloride: 101 mmol/L (ref 96–106)
Creatinine, Ser: 0.91 mg/dL (ref 0.57–1.00)
Globulin, Total: 2.5 g/dL (ref 1.5–4.5)
Glucose: 89 mg/dL (ref 70–99)
Potassium: 4.3 mmol/L (ref 3.5–5.2)
Sodium: 137 mmol/L (ref 134–144)
Total Protein: 6.9 g/dL (ref 6.0–8.5)
eGFR: 90 mL/min/{1.73_m2} (ref >59)

## 2022-11-10 LAB — THYROID PANEL WITH TSH
Free Thyroxine Index: 2.3 (ref 1.2–4.9)
T3 Uptake Ratio: 25 % (ref 24–39)
T4, Total: 9.3 ug/dL (ref 4.5–12.0)
TSH: 0.892 u[IU]/mL (ref 0.450–4.500)

## 2022-11-10 LAB — HEMOGLOBIN A1C
Est. average glucose Bld gHb Est-mCnc: 117 mg/dL
Hgb A1c MFr Bld: 5.7 % — ABNORMAL HIGH (ref 4.8–5.6)

## 2022-11-16 ENCOUNTER — Encounter: Payer: Self-pay | Admitting: Nurse Practitioner

## 2023-05-13 ENCOUNTER — Other Ambulatory Visit: Payer: Self-pay

## 2023-05-13 ENCOUNTER — Emergency Department (HOSPITAL_BASED_OUTPATIENT_CLINIC_OR_DEPARTMENT_OTHER)
Admission: EM | Admit: 2023-05-13 | Discharge: 2023-05-13 | Disposition: A | Discharge status: Home / Self Care | Attending: Emergency Medicine | Admitting: Emergency Medicine

## 2023-05-13 ENCOUNTER — Encounter (HOSPITAL_BASED_OUTPATIENT_CLINIC_OR_DEPARTMENT_OTHER): Payer: Self-pay

## 2023-05-13 DIAGNOSIS — J029 Acute pharyngitis, unspecified: Secondary | ICD-10-CM | POA: Insufficient documentation

## 2023-05-13 DIAGNOSIS — R0981 Nasal congestion: Secondary | ICD-10-CM | POA: Diagnosis not present

## 2023-05-13 LAB — RESP PANEL BY RT-PCR (RSV, FLU A&B, COVID)  RVPGX2
Influenza A by PCR: NEGATIVE
Influenza B by PCR: NEGATIVE
Resp Syncytial Virus by PCR: NEGATIVE
SARS Coronavirus 2 by RT PCR: NEGATIVE

## 2023-05-13 LAB — GROUP A STREP BY PCR: Group A Strep by PCR: NOT DETECTED

## 2023-05-13 NOTE — Discharge Instructions (Signed)
 Please read and follow all provided instructions.  Your diagnoses today include:  1. Sore throat   2. Nasal congestion     You appear to have an upper respiratory infection (URI). An upper respiratory tract infection, or cold, is a viral infection of the air passages leading to the lungs. It should improve gradually after 5-7 days. You may have a lingering cough that lasts for 2- 4 weeks after the infection.  Tests performed today include: Vital signs. See below for your results today.  Strep, COVID, flu, RSV testing: Was negative  Medications prescribed:  None  Home care instructions:  You can take Tylenol and/or Ibuprofen as directed on the packaging for fever reduction and pain relief.    For cough: honey 1/2 to 1 teaspoon (you can dilute the honey in water or another fluid).  You can also use guaifenesin and dextromethorphan for cough. You can use a humidifier for chest congestion and cough.  If you don't have a humidifier, you can sit in the bathroom with the hot shower running.      For sore throat: try warm salt water gargles, cepacol lozenges, throat spray, warm tea or water with lemon/honey, popsicles or ice, or OTC cold relief medicine for throat discomfort.    For congestion: take a daily anti-histamine like Zyrtec, Claritin, and a oral decongestant, such as pseudoephedrine.  You can also use Flonase 1-2 sprays in each nostril daily.    It is important to stay hydrated: drink plenty of fluids (water, gatorade/powerade/pedialyte, juices, or teas) to keep your throat moisturized and help further relieve irritation/discomfort.   Your illness is contagious and can be spread to others, especially during the first 3 or 4 days. It cannot be cured by antibiotics or other medicines. Take basic precautions such as washing your hands often, covering your mouth when you cough or sneeze, and avoiding public places where you could spread your illness to others.   Please continue drinking  plenty of fluids.  Use over-the-counter medicines as needed as directed on packaging for symptom relief.  You may also use ibuprofen or tylenol as directed on packaging for pain or fever.  Do not take multiple medicines containing Tylenol or acetaminophen to avoid taking too much of this medication.  Follow-up instructions: Please follow-up with your primary care provider in the next 3 days for further evaluation of your symptoms if you are not feeling better.   Return instructions:  Please return to the Emergency Department if you experience worsening symptoms.  RETURN IMMEDIATELY IF you develop shortness of breath, confusion or altered mental status, a new rash, become dizzy, faint, or poorly responsive, or are unable to be cared for at home. Please return if you have persistent vomiting and cannot keep down fluids or develop a fever that is not controlled by tylenol or motrin.   Please return if you have any other emergent concerns.  Additional Information:  Your vital signs today were: BP (!) 135/92 (BP Location: Right Arm)   Pulse (!) 101   Temp 98.7 F (37.1 C) (Oral)   Resp 18   Ht 5\' 6"  (1.676 m)   Wt 113.4 kg   LMP 04/09/2023 (Exact Date)   SpO2 98%   BMI 40.35 kg/m  If your blood pressure (BP) was elevated above 135/85 this visit, please have this repeated by your doctor within one month. --------------

## 2023-05-13 NOTE — ED Provider Notes (Signed)
 [place] EMERGENCY DEPARTMENT AT [facility] Provider Note   CSN: [id number] Arrival date & time: 05/13/23  5573     History  Chief Complaint  Patient presents with   Sore Throat    Melinda Garrett is a 28 y.o. female.  Patient presents to the emergency department for evaluation of sore throat and nasal congestion.  Today is day 4 of illness.  No ear pain, fevers.  No significant cough.  No vomiting or diarrhea.  She has taken TheraFlu yesterday with some improvement.  No definitive sick contacts.       Home Medications Prior to Admission medications   Medication Sig Start Date End Date Taking? Authorizing Provider  diclofenac Sodium (VOLTAREN) 1 % GEL Apply 2 g topically 4 (four) times daily. 06/22/22   Claiborne Rigg, NP  naproxen sodium (ALEVE) 220 MG tablet Take 220 mg by mouth.    [provider]  SUMAtriptan (IMITREX) 25 MG tablet Take 1-2 tablets (25-50 mg total) by mouth once for 1 dose. May repeat in 2 hours if headache persists or recurs. For headaches 07/27/22 07/27/22  Claiborne Rigg, NP      Allergies    Patient has no known allergies.    Review of Systems   Review of Systems  Physical Exam Updated Vital Signs BP (!) 135/92 (BP Location: Right Arm)   Pulse (!) 101   Temp 98.7 F (37.1 C) (Oral)   Resp 18   Ht 5\' 6"  (1.676 m)   Wt 113.4 kg   LMP 04/09/2023 (Exact Date)   SpO2 98%   BMI 40.35 kg/m  Physical Exam Vitals and nursing note reviewed.  Constitutional:      Appearance: She is well-developed.  HENT:     Head: Normocephalic and atraumatic.     Jaw: No trismus.     Right Ear: Tympanic membrane, ear canal and external ear normal.     Left Ear: Tympanic membrane, ear canal and external ear normal.     Nose: Congestion present. No mucosal edema or rhinorrhea.     Mouth/Throat:     Mouth: Mucous membranes are moist. Mucous membranes are not dry. No oral lesions.     Pharynx: Uvula midline. No oropharyngeal exudate,  posterior oropharyngeal erythema or uvula swelling.     Tonsils: No tonsillar abscesses.  Eyes:     General:        Right eye: No discharge.        Left eye: No discharge.     Conjunctiva/sclera: Conjunctivae normal.  Cardiovascular:     Rate and Rhythm: Normal rate and regular rhythm.     Heart sounds: Normal heart sounds.  Pulmonary:     Effort: Pulmonary effort is normal. No respiratory distress.     Breath sounds: Normal breath sounds. No wheezing or rales.  Abdominal:     Palpations: Abdomen is soft.     Tenderness: There is no abdominal tenderness.  Musculoskeletal:     Cervical back: Normal range of motion and neck supple.  Lymphadenopathy:     Cervical: No cervical adenopathy.  Skin:    General: Skin is warm and dry.  Neurological:     Mental Status: She is alert.  Psychiatric:        Mood and Affect: Mood normal.     ED Results / Procedures / Treatments   Labs (all labs ordered are listed, but only abnormal results are displayed) Labs Reviewed  RESP PANEL BY RT-PCR (  RSV, FLU A&B, COVID)  [id number]  GROUP A STREP BY PCR    EKG None  Radiology No results found.  Procedures Procedures    Medications Ordered in ED Medications - No data to display  ED Course/ Medical Decision Making/ A&P    Patient seen and examined. History obtained directly from patient. Work-up including labs, imaging, EKG ordered in triage, if performed, were reviewed.    Labs/EKG: Independently reviewed and interpreted.  This included: Flu, COVID, RSV and strep testing were negative  Imaging: None ordered  Medications/Fluids: None ordered  Most recent vital signs reviewed and are as follows: BP (!) 135/92 (BP Location: Right Arm)   Pulse (!) 101   Temp 98.7 F (37.1 C) (Oral)   Resp 18   Ht 5\' 6"  (1.676 m)   Wt 113.4 kg   LMP 04/09/2023 (Exact Date)   SpO2 98%   BMI 40.35 kg/m   Initial impression: Viral URI with nasal congestion and sore throat  Home treatment plan:  Continue supportive treatment  Return instructions discussed with patient: New or worsening symptoms  Follow-up instructions discussed with patient: 3 to 5 days with PCP if not improving                                Medical Decision Making  In regards to the patient's sore throat today, the following dangerous and potentially life threatening etiologies were considered on the differential diagnosis: Lugwig's angina, uvulitis, epiglottis, peritonsillar abscess, retropharyngeal abscess, Lemierre's syndrome. Also considered were more common causes such as: streptococcal pharyngitis, gonococcal pharyngitis, non-bacterial pharyngitis (cold viruses, HSV/coxsackievirus, influenza, COVID-19, infectious mononucleosis, oropharyngeal candidiasis), and other non-infectious causes including seasonal allergies/post-nasal drip, GERD/esophagitis, trauma.   The patient's vital signs, pertinent lab work and imaging were reviewed and interpreted as discussed in the ED course. Hospitalization was considered for further testing, treatments, or serial exams/observation. However as patient is well-appearing, has a stable exam, and reassuring studies today, I do not feel that they warrant admission at this time. This plan was discussed with the patient who verbalizes agreement and comfort with this plan and seems reliable and able to return to the Emergency Department with worsening or changing symptoms.          Final Clinical Impression(s) / ED Diagnoses Final diagnoses:  Sore throat  Nasal congestion    Rx / DC Orders ED Discharge Orders     None         Renne Crigler, PA-C 05/13/23 1206    Benjiman Core, MD 05/14/23 1029

## 2023-05-13 NOTE — ED Triage Notes (Signed)
 Onset Monday congestion and sore throat.  States no SOB.  Appears NAD

## 2023-08-10 ENCOUNTER — Encounter (HOSPITAL_BASED_OUTPATIENT_CLINIC_OR_DEPARTMENT_OTHER): Payer: Self-pay

## 2023-08-10 ENCOUNTER — Emergency Department (HOSPITAL_BASED_OUTPATIENT_CLINIC_OR_DEPARTMENT_OTHER)
Admission: EM | Admit: 2023-08-10 | Discharge: 2023-08-10 | Disposition: A | Discharge status: Home / Self Care | Attending: Emergency Medicine | Admitting: Emergency Medicine

## 2023-08-10 ENCOUNTER — Other Ambulatory Visit: Payer: Self-pay

## 2023-08-10 DIAGNOSIS — S39012A Strain of muscle, fascia and tendon of lower back, initial encounter: Secondary | ICD-10-CM | POA: Insufficient documentation

## 2023-08-10 DIAGNOSIS — Y990 Civilian activity done for income or pay: Secondary | ICD-10-CM | POA: Insufficient documentation

## 2023-08-10 DIAGNOSIS — X501XXA Overexertion from prolonged static or awkward postures, initial encounter: Secondary | ICD-10-CM | POA: Insufficient documentation

## 2023-08-10 DIAGNOSIS — S3992XA Unspecified injury of lower back, initial encounter: Secondary | ICD-10-CM | POA: Diagnosis present

## 2023-08-10 DIAGNOSIS — T148XXA Other injury of unspecified body region, initial encounter: Secondary | ICD-10-CM

## 2023-08-10 MED ORDER — ACETAMINOPHEN 500 MG PO TABS
1000.0000 mg | ORAL_TABLET | Freq: Once | ORAL | Status: AC
  Administered 2023-08-10: 1000 mg via ORAL
  Filled 2023-08-10: qty 2

## 2023-08-10 MED ORDER — PREDNISONE 20 MG PO TABS: 40.0000 mg | ORAL_TABLET | Freq: Every day | ORAL | 0 refills | Status: AC

## 2023-08-10 MED ORDER — CYCLOBENZAPRINE HCL 10 MG PO TABS
10.0000 mg | ORAL_TABLET | Freq: Once | ORAL | Status: AC
  Administered 2023-08-10: 10 mg via ORAL
  Filled 2023-08-10: qty 1

## 2023-08-10 MED ORDER — NAPROXEN 500 MG PO TABS: 500.0000 mg | ORAL_TABLET | Freq: Two times a day (BID) | ORAL | 0 refills | Status: AC

## 2023-08-10 MED ORDER — CYCLOBENZAPRINE HCL 10 MG PO TABS: 10.0000 mg | ORAL_TABLET | Freq: Three times a day (TID) | ORAL | 0 refills | Status: AC

## 2023-08-10 MED ORDER — KETOROLAC TROMETHAMINE 15 MG/ML IJ SOLN
15.0000 mg | Freq: Once | INTRAMUSCULAR | Status: DC
  Filled 2023-08-10: qty 1

## 2023-08-10 NOTE — ED Triage Notes (Signed)
 Pt c/o pulled muscle on L side, advises that I was bending & twisting last night; bent & twisted & then something broke I guess. Advise 2 aleve 0600, no relief.

## 2023-08-10 NOTE — ED Provider Notes (Signed)
 [place] EMERGENCY DEPARTMENT AT [facility] Provider Note   CSN: [id number] Arrival date & time: 08/10/23  1525     History  Chief Complaint  Patient presents with   Back Pain    L    Melinda Garrett is a 28 y.o. female.  28 y.o female with no PMH presents to the ED with a chief complaint of left lower back pain which began this morning.  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  States that the repetitive movement, she does feel some pulling sensation to the left side of her back.  Has remained with any type of movement along with ambulation.  Did take Aleve x 2 without any improvement in her symptoms.  No prior episodes like these.  No fever, no IV drug use, no bowel or bladder complaints, no fever.  The history is provided by the patient.  Back Pain Associated symptoms: no fever        Home Medications Prior to Admission medications   Medication Sig Start Date End Date Taking? Authorizing Provider  cyclobenzaprine (FLEXERIL) 10 MG tablet Take 1 tablet (10 mg total) by mouth 3 (three) times daily for 5 days. 08/10/23 08/15/23 Yes Takeria Marquina, PA-C  naproxen (NAPROSYN) 500 MG tablet Take 1 tablet (500 mg total) by mouth 2 (two) times daily for 7 days. 08/10/23 08/17/23 Yes Vearl Aitken, PA-C  predniSONE (DELTASONE) 20 MG tablet Take 2 tablets (40 mg total) by mouth daily for 5 days. 08/10/23 08/15/23 Yes Iyanla Eilers, PA-C  SUMAtriptan  (IMITREX ) 25 MG tablet Take 1-2 tablets (25-50 mg total) by mouth once for 1 dose. May repeat in 2 hours if headache persists or recurs. For headaches 07/27/22 07/27/22  Collins Dean, NP      Allergies    Patient has no known allergies.    Review of Systems   Review of Systems  Constitutional:  Negative for fever.  Musculoskeletal:  Positive for back pain.    Physical Exam Updated Vital Signs BP 111/75   Pulse 94   Temp 98.4 F (36.9 C)   Resp 16   SpO2 100%  Physical Exam Vitals  and nursing note reviewed.  Constitutional:      Appearance: Normal appearance.  HENT:     Head: Normocephalic and atraumatic.  Cardiovascular:     Rate and Rhythm: Normal rate.  Pulmonary:     Effort: Pulmonary effort is normal.  Abdominal:     General: Abdomen is flat.  Musculoskeletal:     Cervical back: Normal range of motion and neck supple.     Lumbar back: Tenderness present.       Back:     Comments: RLE- KF,KE 5/5 strength LLE- HF, HE 5/5 strength Antalgic gait. No pronator drift. No leg drop.  Patellar reflexes present and symmetric. CN I, II and VIII not tested. CN II-XII grossly intact bilaterally.      Skin:    General: Skin is warm and dry.  Neurological:     Mental Status: She is alert and oriented to person, place, and time.     ED Results / Procedures / Treatments   Labs (all labs ordered are listed, but only abnormal results are displayed) Labs Reviewed - No data to display  EKG None  Radiology No results found.  Procedures Procedures    Medications Ordered in ED Medications  ketorolac (TORADOL) 15 MG/ML injection 15 mg (15 mg Intramuscular Patient Refused/Not Given 08/10/23  1616)  cyclobenzaprine (FLEXERIL) tablet 10 mg (10 mg Oral Given 08/10/23 1616)  acetaminophen  (TYLENOL ) tablet 1,000 mg (1,000 mg Oral Given 08/10/23 1616)    ED Course/ Medical Decision Making/ A&P                                 Medical Decision Making Risk OTC drugs. Prescription drug management.    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  She does report pain to the area exacerbated with any type of movement and ambulation.  Vitals are within normal limits.  She does not have any prior history of IV drug use, fever, bowel or bladder complaints.  To pain.  Palpable tenderness along left lumbar spine and buttocks. No trauma, no  midline tenderness. Low concern for epidural abscess, fracture or deeper space infection. Suspect MSK.    Given short course of steroids, muscle relaxers, anti-inflammatories to help with pain control.  Return precautions discussed at length, hemodynamically stable for discharge.   Portions of this note were generated with Scientist, clinical (histocompatibility and immunogenetics). Dictation errors may occur despite best attempts at proofreading.   Final Clinical Impression(s) / ED Diagnoses Final diagnoses:  Muscle strain    Rx / DC Orders ED Discharge Orders          Ordered    cyclobenzaprine (FLEXERIL) 10 MG tablet  3 times daily        08/10/23 1600    predniSONE (DELTASONE) 20 MG tablet  Daily        08/10/23 1600    naproxen (NAPROSYN) 500 MG tablet  2 times daily        08/10/23 1600              Shantea Poulton, PA-C 08/10/23 1622    Ninetta Basket, MD 08/11/23 1559

## 2023-08-10 NOTE — Discharge Instructions (Addendum)
 You may start taking the steroids today, do not take the muscle relaxer or the anti-inflammatory today as you received these in the emergency department.  I have prescribed muscle relaxers for your pain, please do not drink or drive while taking this medications as it can make you drowsy.   I have also prescribed steroids, be aware this medication can cause insomnia, appetite changes.    Please follow-up with PCP in 1 week for reevaluation of your symptoms.If you experience any bowel or bladder incontinence, fever, worsening in your symptoms please return to the ED.

## 2024-01-18 ENCOUNTER — Ambulatory Visit (INDEPENDENT_AMBULATORY_CARE_PROVIDER_SITE_OTHER): Admitting: Family

## 2024-01-18 ENCOUNTER — Encounter: Payer: Self-pay | Admitting: Family

## 2024-01-18 ENCOUNTER — Other Ambulatory Visit: Payer: Self-pay | Admitting: Family

## 2024-01-18 VITALS — BP 117/78 | HR 88 | Temp 99.0°F | Resp 16 | Ht 66.0 in | Wt 313.6 lb

## 2024-01-18 DIAGNOSIS — M549 Dorsalgia, unspecified: Secondary | ICD-10-CM

## 2024-01-18 DIAGNOSIS — Z7689 Persons encountering health services in other specified circumstances: Secondary | ICD-10-CM

## 2024-01-18 DIAGNOSIS — N644 Mastodynia: Secondary | ICD-10-CM | POA: Diagnosis not present

## 2024-01-18 DIAGNOSIS — R0683 Snoring: Secondary | ICD-10-CM

## 2024-01-18 DIAGNOSIS — Z23 Encounter for immunization: Secondary | ICD-10-CM

## 2024-01-18 DIAGNOSIS — N63 Unspecified lump in unspecified breast: Secondary | ICD-10-CM

## 2024-01-18 DIAGNOSIS — R519 Headache, unspecified: Secondary | ICD-10-CM

## 2024-01-18 DIAGNOSIS — Z803 Family history of malignant neoplasm of breast: Secondary | ICD-10-CM | POA: Diagnosis not present

## 2024-01-18 MED ORDER — SUMATRIPTAN SUCCINATE 25 MG PO TABS: ORAL_TABLET | ORAL | 0 refills | Status: AC

## 2024-01-18 MED ORDER — GABAPENTIN 300 MG PO CAPS: 300.0000 mg | ORAL_CAPSULE | Freq: Every evening | ORAL | 0 refills | Status: AC | PRN

## 2024-01-18 NOTE — Progress Notes (Signed)
 Subjective:    Melinda Garrett - 28 y.o. female MRN [id number]  Date of birth: 03-28-1995  HPI  Melinda Garrett is to establish care.  Current issues and/or concerns: - Lower back pain radiating to left lower extremity. Denies recent trauma/injury and red flag symptoms. States worse with walking and standing for long amounts of time. States she took some of her mother's muscle relaxer which didn't help much. States she was seen by Physical Therapy and thinks they may have made symptoms worse.  - States right breast lump present for a while. States right breast lump seems larger during menses. Reports family history of breast cancer.  - States she was supposed to have a sleep study in the past but was unable to do so due to health insurance. States thinks possible sleep apnea may be causing headaches. Denies red flag symptoms.   ROS per HPI    Health Maintenance:  Health Maintenance Due  Topic Date Due   HPV VACCINES (2 - 3-dose series) 04/03/2014   Hepatitis C Screening  Never done   Cervical Cancer Screening (Pap smear)  Never done   Influenza Vaccine  09/30/2023     Past Medical History: Patient Active Problem List   Diagnosis Date Noted   Vertigo 10/23/2012      Social History   reports that she has quit smoking. Her smoking use included cigars. She has never used smokeless tobacco. She reports that she does not currently use alcohol. She reports that she does not use drugs.   Family History  family history includes Autism in her cousin; Cerebral palsy in her sister; Other in her sister; Seizures in her sister.   Medications: reviewed and updated   Objective:   Physical Exam BP 117/78   Pulse 88   Temp 99 F (37.2 C) (Oral)   Resp 16   Ht 5' 6 (1.676 m)   Wt (!) 313 lb 9.6 oz (142.2 kg)   LMP 01/13/2024 (Exact Date)   SpO2 98%   BMI 50.62 kg/m   Physical Exam HENT:     Head: Normocephalic and atraumatic.     Nose: Nose normal.     Mouth/Throat:      Mouth: Mucous membranes are moist.     Pharynx: Oropharynx is clear.  Eyes:     Extraocular Movements: Extraocular movements intact.     Conjunctiva/sclera: Conjunctivae normal.     Pupils: Pupils are equal, round, and reactive to light.  Cardiovascular:     Rate and Rhythm: Normal rate and regular rhythm.     Pulses: Normal pulses.     Heart sounds: Normal heart sounds.  Pulmonary:     Effort: Pulmonary effort is normal.     Breath sounds: Normal breath sounds.  Chest:     Comments: Patient declined. Musculoskeletal:        General: Normal range of motion.     Cervical back: Normal, normal range of motion and neck supple.     Thoracic back: Normal.     Lumbar back: Tenderness present.     Right hip: Normal.     Left hip: Normal.     Right upper leg: Normal.     Left upper leg: Normal.     Right knee: Normal.     Left knee: Normal.     Right lower leg: Normal.     Left lower leg: Normal.     Right ankle: Normal.     Left ankle: Normal.  Right foot: Normal.     Left foot: Normal.  Neurological:     General: No focal deficit present.     Mental Status: She is alert and oriented to person, place, and time.  Psychiatric:        Mood and Affect: Mood normal.        Behavior: Behavior normal.        Assessment & Plan:  1. Encounter to establish care (Primary) - Patient presents today to establish care. During the interim follow-up with primary provider as scheduled.  - Return for annual physical examination, labs, and health maintenance. Arrive fasting meaning having no food for at least 8 hours prior to appointment. You may have only water or black coffee. Please take scheduled medications as normal.  2. Back pain, unspecified back location, unspecified back pain laterality, unspecified chronicity - Gabapentin as prescribed. Counseled on medication adherence/adverse effects.  - Referral to Orthopedic Surgery for evaluation/management.  - Follow-up with primary provider  as scheduled. - gabapentin (NEURONTIN) 300 MG capsule; Take 1 capsule (300 mg total) by mouth at bedtime as needed.  Dispense: 90 capsule; Refill: 0 - Ambulatory referral to Orthopedic Surgery  3. Breast pain 4. Family history of breast cancer - Routine screening.  - MM Digital Screening; Future - MM Digital Diagnostic Bilat; Future - US  BREAST COMPLETE UNI LEFT INC AXILLA; Future - US  BREAST COMPLETE UNI RIGHT INC AXILLA; Future  5. Snoring - Routine screening.  - PSG Sleep Study; Future  6. Nonintractable headache, unspecified chronicity pattern, unspecified headache type - Sumatriptan  as prescribed. Counseled on medication adherence/adverse effects.  - Follow-up with primary provider as scheduled. - SUMAtriptan  (IMITREX ) 25 MG tablet; Take 25 mg (1 tablet total) by mouth at the start of the headache. May repeat in 2 hours x 1 if headache persists. Max of 2 tablets/24 hours.  Dispense: 90 tablet; Refill: 0  7. Immunization due - Administered.  - Flu vaccine trivalent PF, 6mos and older(Flulaval,Afluria,Fluarix,Fluzone)    Patient was given clear instructions to go to Emergency Department or return to medical center if symptoms don't improve, worsen, or new problems develop.The patient verbalized understanding.  I discussed the assessment and treatment plan with the patient. The patient was provided an opportunity to ask questions and all were answered. The patient agreed with the plan and demonstrated an understanding of the instructions.   The patient was advised to call back or seek an in-person evaluation if the symptoms worsen or if the condition fails to improve as anticipated.    Greig Drones, NP 01/18/2024, 11:12 AM Primary Care at [facility]

## 2024-01-18 NOTE — Progress Notes (Signed)
 Lump on right breast, pitch nerve on the left side that is bothering the patient

## 2024-02-02 ENCOUNTER — Ambulatory Visit: Payer: Self-pay | Admitting: Orthopaedic Surgery

## 2024-02-02 DIAGNOSIS — Z6841 Body Mass Index (BMI) 40.0 and over, adult: Secondary | ICD-10-CM

## 2024-02-02 DIAGNOSIS — M545 Low back pain, unspecified: Secondary | ICD-10-CM

## 2024-02-02 DIAGNOSIS — G8929 Other chronic pain: Secondary | ICD-10-CM

## 2024-02-02 NOTE — Progress Notes (Signed)
 Office Visit Note   Patient: Melinda Garrett           Date of Birth: 1995-10-28           MRN: [id number] Visit Date: 02/02/2024              Requested by: Jaycee Greig PARAS, NP [address] [place],  [place] [address] PCP: Jaycee Greig PARAS, NP   Assessment & Plan: Visit Diagnoses:  1. Chronic left-sided low back pain without sciatica   2. Body mass index 40.0-44.9, adult [facility])     Plan: 28 year old female with left low back pain.  Recommend acupuncture cupping.  Discussed importance of weight reduction.  Will talk to PCP about better nutrition.  Could consider MRI if symptoms do not improve with weight reduction.  Follow-Up Instructions: No follow-ups on file.   Orders:  No orders of the defined types were placed in this encounter.  No orders of the defined types were placed in this encounter.     Procedures: No procedures performed   Clinical Data: No additional findings.   Subjective: Chief Complaint  Patient presents with   Left Hip - Pain   Lower Back - Pain    HPI Patient is a 28 year old female here for evaluation of left buttock and left sided low back pain.  Reports burning and numbness in the area.  Tried PT which did not help.  Gabapentin  did not help.  Has had the symptoms for about a year.  Denies any groin pain. Review of Systems  Constitutional: Negative.   HENT: Negative.    Eyes: Negative.   Respiratory: Negative.    Cardiovascular: Negative.   Endocrine: Negative.   Musculoskeletal: Negative.   Neurological: Negative.   Hematological: Negative.   Psychiatric/Behavioral: Negative.    All other systems reviewed and are negative.    Objective: Vital Signs: LMP 01/13/2024 (Exact Date)   Physical Exam Vitals and nursing note reviewed.  Constitutional:      Appearance: She is well-developed.  HENT:     Head: Atraumatic.     Nose: Nose normal.  Eyes:     Extraocular Movements: Extraocular movements intact.  Cardiovascular:      Pulses: Normal pulses.  Pulmonary:     Effort: Pulmonary effort is normal.  Abdominal:     Palpations: Abdomen is soft.  Musculoskeletal:     Cervical back: Neck supple.  Skin:    General: Skin is warm.     Capillary Refill: Capillary refill takes less than 2 seconds.  Neurological:     Mental Status: She is alert. Mental status is at baseline.  Psychiatric:        Behavior: Behavior normal.        Thought Content: Thought content normal.        Judgment: Judgment normal.     Ortho Exam Exam of the left hip joint shows fluid painless range of motion.  She is tender to the left paraspinous lumbar musculature.  Negative FABER.  No sciatic tension signs. Specialty Comments:  No specialty comments available.  Imaging: No results found.   PMFS History: Patient Active Problem List   Diagnosis Date Noted   Vertigo 10/23/2012   No past medical history on file.  Family History  Problem Relation Age of Onset   Seizures Sister        1 sister has sz, CP, Dev Delays   Cerebral palsy Sister    Other Sister  Autism Cousin        Maternal second cousin has Autism    Past Surgical History:  Procedure Laterality Date   umbilical surgery     Social History   Occupational History   Not on file  Tobacco Use   Smoking status: Former    Types: Cigars   Smokeless tobacco: Never  Vaping Use   Vaping status: Never Used  Substance and Sexual Activity   Alcohol use: Not Currently   Drug use: Never   Sexual activity: Yes    Birth control/protection: Condom

## 2024-02-14 ENCOUNTER — Ambulatory Visit
Admission: RE | Admit: 2024-02-14 | Discharge: 2024-02-14 | Disposition: A | Source: Ambulatory Visit | Attending: Family | Admitting: Family

## 2024-02-14 ENCOUNTER — Other Ambulatory Visit: Payer: Self-pay | Admitting: Family

## 2024-02-14 ENCOUNTER — Ambulatory Visit: Payer: Self-pay | Admitting: Family

## 2024-02-14 DIAGNOSIS — Z803 Family history of malignant neoplasm of breast: Secondary | ICD-10-CM

## 2024-02-14 DIAGNOSIS — N63 Unspecified lump in unspecified breast: Secondary | ICD-10-CM

## 2024-03-06 ENCOUNTER — Ambulatory Visit
Admission: RE | Admit: 2024-03-06 | Discharge: 2024-03-06 | Disposition: A | Source: Ambulatory Visit | Attending: Family | Admitting: Family

## 2024-03-06 ENCOUNTER — Ambulatory Visit: Payer: Self-pay | Admitting: Family

## 2024-03-06 DIAGNOSIS — N63 Unspecified lump in unspecified breast: Secondary | ICD-10-CM

## 2024-03-06 DIAGNOSIS — Z803 Family history of malignant neoplasm of breast: Secondary | ICD-10-CM

## 2024-03-06 HISTORY — PX: BREAST BIOPSY: SHX20

## 2024-03-07 ENCOUNTER — Ambulatory Visit: Payer: Self-pay | Admitting: Family

## 2024-03-07 LAB — SURGICAL PATHOLOGY

## 2024-07-30 ENCOUNTER — Encounter: Admitting: Family
